# Patient Record
Sex: Female | Born: 1970 | Race: Black or African American | Hispanic: No | Marital: Married | Smoking: Never smoker
Health system: Southern US, Community
[De-identification: ages and names within clinical notes are randomized; demographics above are authoritative.]

## PROBLEM LIST (undated history)

## (undated) DIAGNOSIS — G629 Polyneuropathy, unspecified: Secondary | ICD-10-CM

## (undated) DIAGNOSIS — D259 Leiomyoma of uterus, unspecified: Secondary | ICD-10-CM

## (undated) DIAGNOSIS — K219 Gastro-esophageal reflux disease without esophagitis: Secondary | ICD-10-CM

## (undated) DIAGNOSIS — I499 Cardiac arrhythmia, unspecified: Secondary | ICD-10-CM

## (undated) DIAGNOSIS — M5126 Other intervertebral disc displacement, lumbar region: Secondary | ICD-10-CM

## (undated) DIAGNOSIS — N289 Disorder of kidney and ureter, unspecified: Secondary | ICD-10-CM

## (undated) DIAGNOSIS — R000 Tachycardia, unspecified: Secondary | ICD-10-CM

## (undated) DIAGNOSIS — E119 Type 2 diabetes mellitus without complications: Secondary | ICD-10-CM

## (undated) DIAGNOSIS — I10 Essential (primary) hypertension: Secondary | ICD-10-CM

## (undated) DIAGNOSIS — R51 Headache: Secondary | ICD-10-CM

## (undated) DIAGNOSIS — G43909 Migraine, unspecified, not intractable, without status migrainosus: Secondary | ICD-10-CM

## (undated) DIAGNOSIS — K589 Irritable bowel syndrome without diarrhea: Secondary | ICD-10-CM

## (undated) HISTORY — PX: TUBAL LIGATION: SHX77

## (undated) HISTORY — DX: Essential (primary) hypertension: I10

## (undated) HISTORY — DX: Leiomyoma of uterus, unspecified: D25.9

## (undated) HISTORY — DX: Type 2 diabetes mellitus without complications: E11.9

## (undated) HISTORY — DX: Cardiac arrhythmia, unspecified: I49.9

---

## 1998-12-30 ENCOUNTER — Ambulatory Visit (HOSPITAL_COMMUNITY): Admission: RE | Admit: 1998-12-30 | Discharge: 1998-12-30 | Payer: Self-pay | Admitting: *Deleted

## 1998-12-30 ENCOUNTER — Encounter: Payer: Self-pay | Admitting: *Deleted

## 1999-01-19 ENCOUNTER — Ambulatory Visit (HOSPITAL_COMMUNITY): Admission: RE | Admit: 1999-01-19 | Discharge: 1999-01-19 | Payer: Self-pay | Admitting: Gastroenterology

## 2001-01-22 ENCOUNTER — Other Ambulatory Visit: Admission: RE | Admit: 2001-01-22 | Discharge: 2001-01-22 | Payer: Self-pay | Admitting: Obstetrics and Gynecology

## 2002-02-18 ENCOUNTER — Other Ambulatory Visit: Admission: RE | Admit: 2002-02-18 | Discharge: 2002-02-18 | Payer: Self-pay | Admitting: Obstetrics and Gynecology

## 2003-03-11 ENCOUNTER — Other Ambulatory Visit: Admission: RE | Admit: 2003-03-11 | Discharge: 2003-03-11 | Payer: Self-pay | Admitting: *Deleted

## 2004-04-24 ENCOUNTER — Other Ambulatory Visit: Admission: RE | Admit: 2004-04-24 | Discharge: 2004-04-24 | Payer: Self-pay | Admitting: Obstetrics and Gynecology

## 2005-12-14 ENCOUNTER — Encounter: Admission: RE | Admit: 2005-12-14 | Discharge: 2005-12-14 | Payer: Self-pay | Admitting: Neurosurgery

## 2006-02-13 ENCOUNTER — Encounter: Admission: RE | Admit: 2006-02-13 | Discharge: 2006-02-13 | Payer: Self-pay | Admitting: Internal Medicine

## 2008-01-01 ENCOUNTER — Ambulatory Visit (HOSPITAL_COMMUNITY): Admission: RE | Admit: 2008-01-01 | Discharge: 2008-01-01 | Payer: Self-pay | Admitting: Obstetrics and Gynecology

## 2009-05-13 ENCOUNTER — Encounter: Admission: RE | Admit: 2009-05-13 | Discharge: 2009-05-13 | Payer: Self-pay | Admitting: Family Medicine

## 2011-02-13 ENCOUNTER — Ambulatory Visit (INDEPENDENT_AMBULATORY_CARE_PROVIDER_SITE_OTHER): Payer: Federal, State, Local not specified - PPO

## 2011-02-13 DIAGNOSIS — E860 Dehydration: Secondary | ICD-10-CM

## 2011-02-13 DIAGNOSIS — J159 Unspecified bacterial pneumonia: Secondary | ICD-10-CM

## 2011-02-13 DIAGNOSIS — R002 Palpitations: Secondary | ICD-10-CM

## 2011-02-16 ENCOUNTER — Ambulatory Visit (INDEPENDENT_AMBULATORY_CARE_PROVIDER_SITE_OTHER): Payer: Federal, State, Local not specified - PPO

## 2011-02-16 DIAGNOSIS — R0602 Shortness of breath: Secondary | ICD-10-CM

## 2011-02-16 DIAGNOSIS — J159 Unspecified bacterial pneumonia: Secondary | ICD-10-CM

## 2012-01-22 ENCOUNTER — Other Ambulatory Visit: Payer: Self-pay | Admitting: Internal Medicine

## 2012-01-22 DIAGNOSIS — N949 Unspecified condition associated with female genital organs and menstrual cycle: Secondary | ICD-10-CM

## 2012-01-24 ENCOUNTER — Ambulatory Visit
Admission: RE | Admit: 2012-01-24 | Discharge: 2012-01-24 | Disposition: A | Discharge status: Home / Self Care | Payer: Federal, State, Local not specified - PPO | Source: Ambulatory Visit | Attending: Internal Medicine | Admitting: Internal Medicine

## 2012-01-24 DIAGNOSIS — N949 Unspecified condition associated with female genital organs and menstrual cycle: Secondary | ICD-10-CM

## 2012-10-19 ENCOUNTER — Ambulatory Visit (INDEPENDENT_AMBULATORY_CARE_PROVIDER_SITE_OTHER): Payer: Federal, State, Local not specified - PPO | Admitting: Family Medicine

## 2012-10-19 VITALS — BP 126/66 | HR 84 | Temp 99.3°F | Resp 16 | Ht 67.0 in | Wt 162.0 lb

## 2012-10-19 DIAGNOSIS — M7521 Bicipital tendinitis, right shoulder: Secondary | ICD-10-CM

## 2012-10-19 DIAGNOSIS — M7520 Bicipital tendinitis, unspecified shoulder: Secondary | ICD-10-CM

## 2012-10-19 MED ORDER — PREDNISONE 20 MG PO TABS: ORAL_TABLET | ORAL | Status: DC

## 2012-10-19 NOTE — Progress Notes (Signed)
  Subjective:    Patient ID: Ashley Snyder, female    DOB: 07/01/1970, 42 y.o.   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 Has been going on for extended period of time. Hasnt taken anything for symptoms.   Review of Systems     Objective:   Physical Exam Tender proximal biceps tendon of right shoulder.  FROM shoulder and neck        Assessment & Plan:

## 2012-10-19 NOTE — Patient Instructions (Addendum)
Biceps Tendon Tendinitis (Proximal) and Tenosynovitis with Rehab Tendonitis and tenosynovitis involve inflammation of the tendon and the tendon lining (sheath). The proximal biceps tendon is vulnerable to tendonitis and tenosynovitis, which causes pain and discomfort in the front of the shoulder and upper arm. The tendon lining secretes a fluid that helps lubricate the tendon, allowing for proper function without pain. When the tendon and its lining become inflamed, the tendon can no longer glide smoothly, causing pain. The proximal biceps tendon connects the biceps muscle to two bones of the shoulder. It is important for proper function of the elbow and turning the palm upward (supination) using the wrist. Proximal biceps tendon tendinitis may include a grade 1 or 2 strain of the tendon. Grade 1 strains involve a slight pull of the tendon without signs of tearing and no observed tendon lengthening. There is also no loss of strength. Grade 2 strains involve small tears in the tendon fibers. The tendon or muscle is stretched and strength is usually decreased.  SYMPTOMS   Pain, tenderness, swelling, warmth, or redness over the front of the shoulder.  Pain that gets worse with shoulder and elbow use, especially against resistance.  Limited motion of the shoulder or elbow.  Crackling sound (crepitation) when the tendon or shoulder is moved or touched. CAUSES  The symptoms of biceps tendonitis are due to inflammation of the tendon. Inflammation may be caused by:  Strain from sudden increase in amount or intensity of activity.  Direct blow or injury to the elbow (uncommon).  Overuse or repetitive elbow bending or wrist rotation, particularly when turning the palm up, or with elbow hyperextension. RISK INCREASES WITH:  Sports that involve contact or overhead arm activity (throwing sports, gymnastics, weightlifting, bodybuilding, rock climbing).  Heavy labor.  Poor strength and  flexibility.  Failure to warm up properly before activity. PREVENTION  Warm up and stretch properly before activity.  Allow time for recovery between activities.  Maintain physical fitness:  Strength, flexibility, and endurance.  Cardiovascular fitness.  Learn and use proper exercise technique. PROGNOSIS  With proper treatment, proximal biceps tendon tendonitis and tenosynovitis is usually curable within 6 weeks. Healing is usually quicker if the cause was a direct blow, not overuse.  RELATED COMPLICATIONS   Longer healing time if not properly treated or if not given enough time to heal.  Chronically inflamed tendon that causes persistent pain with activity, that may progress to constant pain and potentially rupture of the tendon.  Recurring symptoms, especially if activity is resumed too soon or with overuse, a direct blow, or use of poor exercise technique. TREATMENT Treatment first involves ice and medicine, to reduce pain and inflammation. It is helpful to modify activities that cause pain, to reduce the chances of causing the condition to get worse. Strengthening and stretching exercises should be performed to promote proper use of the muscles of the shoulder. These exercises may be performed at home or with a therapist. Other treatments may be given such as ultrasound or heat therapy. A corticosteroid injection may be recommended to help reduce inflammation of the tendon lining. Surgery is usually not necessary. Sometimes, if symptoms last for greater than 6 months, surgery will be advised to detach the tendon and re-insert it into the arm bone. Surgery to correct other shoulder problems that may be contributing to tendinitis may be advised before surgery for the tendinitis itself.  MEDICATION  If pain medicine is needed, nonsteroidal anti-inflammatory medicines (aspirin and ibuprofen), or other minor pain relievers (  acetaminophen), are often advised.  Do not take pain medicine  for 7 days before surgery.  Prescription pain relievers may be given if your caregiver thinks they are needed. Use only as directed and only as much as you need.  Corticosteroid injections may be given. These injections should only be used on the most severe cases, as one can only receive a limited number of them. HEAT AND COLD   Cold treatment (icing) should be applied for 10 to 15 minutes every 2 to 3 hours for inflammation and pain, and immediately after activity that aggravates your symptoms. Use ice packs or an ice massage.  Heat treatment may be used before performing stretching and strengthening activities prescribed by your caregiver, physical therapist, or athletic trainer. Use a heat pack or a warm water soak. SEEK MEDICAL CARE IF:   Symptoms get worse or do not improve in 2 weeks, despite treatment.  New, unexplained symptoms develop. (Drugs used in treatment may produce side effects.) EXERCISES RANGE OF MOTION (ROM) AND EXERCISES - Biceps Tendon (Proximal) and Tenosynovitis These exercises may help you when beginning to rehabilitate your injury. Your symptoms may go away with or without further involvement from your physician, physical therapist, or athletic trainer. While completing these exercises, remember:   Restoring tissue flexibility helps normal motion to return to the joints. This allows healthier, less painful movement and activity.  An effective stretch should be held for at least 30 seconds.  A stretch should never be painful. You should only feel a gentle lengthening or release in the stretched tissue. STRETCH  Flexion, Standing  Stand with good posture. With an underhand grip on your right / left hand and an overhand grip on the opposite hand, grasp a broomstick or cane so that your hands are a little more than shoulder width apart.  Keeping your right / left elbow straight and shoulder muscles relaxed, push the stick with your opposite hand to raise your right /  left arm in front of your body and then overhead. Raise your arm until you feel a stretch in your right / left shoulder, but before you have increased shoulder pain.  Try to avoid shrugging your right / left shoulder as your arm rises, by keeping your shoulder blade tucked down and toward your mid-back spine. Hold for __________ seconds.  Slowly return to the starting position. Repeat __________ times. Complete this exercise __________ times per day. STRETCH  Abduction, Supine  Lie on your back. With an underhand grip on your right / left hand and an overhand grip on the opposite hand, grasp a broomstick or cane so that your hands are a little more than shoulder width apart.  Keeping your right / left elbow straight and shoulder muscles relaxed, push the stick with your opposite hand to raise your right / left arm out to the side of your body and then overhead. Raise your arm until you feel a stretch in your right / left shoulder, but before you have increased shoulder pain.  Try to avoid shrugging your right / left shoulder as your arm rises, by keeping your shoulder blade tucked down and toward your mid-back spine. Hold for __________ seconds.  Slowly return to the starting position. Repeat __________ times. Complete this exercise __________ times per day. ROM  Flexion, Active-Assisted  Lie on your back. You may bend your knees for comfort.  Grasp a broomstick or cane so your hands are about shoulder width apart. Your right / left hand should   grip the end of the stick so that your hand is positioned "thumbs-up," as if you were about to shake hands.  Using your healthy arm to lead, raise your right / left arm overhead until you feel a gentle stretch in your shoulder. Hold for __________ seconds.  Use the stick to assist in returning your right / left arm to its starting position. Repeat __________ times. Complete this exercise __________ times per day.  STRETCH  Flexion, Standing   Stand  facing a wall. Walk your right / left fingers up the wall until you feel a moderate stretch in your shoulder. As your hand gets higher, you may need to step closer to the wall or use a door frame to walk through.  Try to avoid shrugging your right / left shoulder as your arm rises, by keeping your shoulder blade tucked down and toward your mid-back spine.  Hold for __________ seconds. Use your other hand, if needed, to ease out of the stretch and return to the starting position. Repeat __________ times. Complete this exercise __________ times per day.  ROM - Internal Rotation   Using underhand grips, grasp a stick behind your back with both hands.  While standing upright with good posture, slide the stick up your back until you feel a mild stretch in the front of your shoulder.  Hold for __________ seconds. Slowly return to your starting position. Repeat __________ times. Complete this exercise __________ times per day.  STRETCH - Internal Rotation  Place your right / left hand behind your back, palm-up.  Throw a towel or belt over your opposite shoulder. Grasp the towel with your right / left hand.  While keeping an upright posture, gently pull up on the towel until you feel a stretch in the front of your right / left shoulder.  Avoid shrugging your right / left shoulder as your arm rises, by keeping your shoulder blade tucked down and toward your mid-back spine.  Hold for __________ seconds. Release the stretch by lowering your opposite hand. Repeat __________ times. Complete this exercise __________ times per day. STRENGTHENING EXERCISES - Biceps Tendon Tendinitis (Proximal) and Tenosynovitis These exercises may help you regain your strength after your physician has discontinued your restraint in a cast or brace. They may resolve your symptoms with or without further involvement from your physician, physical therapist or athletic trainer. While completing these exercises, remember:    Muscles can gain both the endurance and the strength needed for everyday activities through controlled exercises.  Complete these exercises as instructed by your physician, physical therapist or athletic trainer. Increase the resistance and repetitions only as guided.  You may experience muscle soreness or fatigue, but the pain or discomfort you are trying to eliminate should never worsen during these exercises. If this pain does get worse, stop and make sure you are following the directions exactly. If the pain is still present after adjustments, discontinue the exercise until you can discuss the trouble with your caregiver. STRENGTH - Elbow Flexors, Isometric  Stand or sit upright on a firm surface. Place your right / left arm so that your hand is palm-up and at the height of your waist.  Place your opposite hand on top of your forearm. Gently push down as your right / left arm resists. Push as hard as you can with both arms, without causing any pain or movement at your right / left elbow. Hold this stationary position for __________ seconds.  Gradually release the tension in both   arms. Allow your muscles to relax completely before repeating. Repeat __________ times. Complete this exercise __________ times per day. STRENGTH - Shoulder Flexion, Isometric  With good posture and facing a wall, stand or sit about 4-6 inches away.  Keeping your right / left elbow straight, gently press the top of your fist into the wall. Increase the pressure gradually until you are pressing as hard as you can, without shrugging your shoulder or increasing any shoulder discomfort.  Hold for __________ seconds.  Release the tension slowly. Relax your shoulder muscles completely before you start the next repetition. Repeat __________ times. Complete this exercise __________ times per day.  STRENGTH  Elbow Flexors, Supinated  With good posture, stand or sit on a firm chair without armrests. Allow your right /  left arm to rest at your side with your palm facing forward.  Holding a __________ weight, or gripping a rubber exercise band or tubing,  bring your hand toward your shoulder.  Allow your muscles to control the resistance as your hand returns to your side. Repeat __________ times. Complete this exercise __________ times per day.  STRENGTH - Shoulder Flexion  Stand or sit with good posture. Grasp a __________ weight, or an exercise band or tubing, so that your hand is "thumbs-up," like when you shake hands.  Slowly lift your right / left arm as far as you can, without increasing any shoulder pain. At first, many people can only raise their hand to shoulder height.  Avoid shrugging your right / left shoulder as your arm rises, by keeping your shoulder blade tucked down and toward your mid-back spine.  Hold for __________ seconds. Control the descent of your hand as you slowly return to your starting position. Repeat __________ times. Complete this exercise __________ times per day. Document Released: 02/19/2005 Document Revised: 05/14/2011 Document Reviewed: 06/03/2008 [employer] Patient Information 2014 [employer].  

## 2012-12-02 ENCOUNTER — Emergency Department (INDEPENDENT_AMBULATORY_CARE_PROVIDER_SITE_OTHER)
Admission: EM | Admit: 2012-12-02 | Discharge: 2012-12-02 | Disposition: A | Discharge status: Home / Self Care | Payer: Worker's Compensation | Source: Home / Self Care | Attending: Emergency Medicine | Admitting: Emergency Medicine

## 2012-12-02 ENCOUNTER — Encounter (HOSPITAL_COMMUNITY): Payer: Self-pay | Admitting: Emergency Medicine

## 2012-12-02 DIAGNOSIS — S0990XA Unspecified injury of head, initial encounter: Secondary | ICD-10-CM | POA: Diagnosis not present

## 2012-12-02 MED ORDER — DICLOFENAC SODIUM 75 MG PO TBEC: 75.0000 mg | DELAYED_RELEASE_TABLET | Freq: Two times a day (BID) | ORAL | Status: DC

## 2012-12-02 MED ORDER — IBUPROFEN 800 MG PO TABS
ORAL_TABLET | ORAL | Status: AC
  Filled 2012-12-02: qty 1

## 2012-12-02 MED ORDER — IBUPROFEN 800 MG PO TABS
800.0000 mg | ORAL_TABLET | Freq: Once | ORAL | Status: AC
  Administered 2012-12-02: 800 mg via ORAL

## 2012-12-02 NOTE — ED Notes (Signed)
Pt c/o head inj today at work onset 1700 Pt reports she hit her head against a metal gate to the left temporal side Sxs include: intermittent HA, teeth pain Denies: LOC, n/v, blurry vision Has not had any meds for sxs.... She is alert w/no signs of acute distress.

## 2012-12-02 NOTE — ED Provider Notes (Signed)
Chief Complaint:   Chief Complaint  Patient presents with  . Head Injury    History of Present Illness:   Ashley Snyder is a 42 year old female who around 5 PM was struck by a metal door at work in her left temple. The patient states that she "saw stars" for a few seconds, but there was no loss of consciousness. He she has had some headache ever since then and notes aching in her teeth and she's also had some aching in her sinus areas. She denies any diplopia, blurry vision, or fluid or blood from the nose or her ears. She denies any loose or broken teeth or neck pain. She does not have difficulty with focusing, thinking, or concentrating. She denies feeling fuzzy, lightheaded, or dizzy. There has been no ringing in her ears. She denies any numbness, tingling, paresthesias, muscle weakness, difficulty with speech, swallowing, ambulation, coordination, balance, or equilibrium. No nausea or vomiting.  Review of Systems:  Other than noted above, the patient denies any of the following symptoms: Systemic:  No fever or chills. Eye:  No eye pain, redness, diplopia or blurred vision ENT:  No bleeding from nose or ears.  No loose or broken teeth. Neck:  No pain or limited ROM. GI:  No nausea or vomiting. Neuro:  No loss of consciousness, seizure activity, numbness, tingling, or weakness.  PMFSH:  Past medical history, family history, social history, meds, and allergies were reviewed. No history of anticoagulent use.    Physical Exam:   Vital signs:  BP 141/86  Pulse 87  Temp(Src) 97.9 F (36.6 C) (Oral)  Resp 16  SpO2 100% General:  Alert and oriented times 3.  In no distress. Eye:  PERRL, full EOMs.  Lids and conjunctivas normal. Fundi benign. HEENT:  There is mild pain to palpation in the left temple area. No bruising, swelling, deformity, laceration, or abrasion. TMs and canals normal, nasal mucosa normal.  No oral lacerations.  Teeth were intact without obvious oral trauma. Neck:   Non tender.  Full ROM without pain. Neurological:  Alert and oriented.  Cranial nerves intact.  No pronator drift. Finger to nose test was normal.  No muscle weakness. DTRs were symmetrical.  Sensation was intact to light touch. Gait was normal.  Upon trying to perform Romberg sign, she fell to the right once but then was able to perform a well without any difficulty. She does have a little difficulty performing tandem gait.  Course in Urgent Care Center:   Given ibuprofen 800 mg by mouth.  Assessment:  The encounter diagnosis was Head injury, initial encounter.  No evidence of concussion or intracranial injury.  Plan:   1.  Meds:  The following meds were prescribed:   Discharge Medication List as of 12/02/2012  8:32 PM    START taking these medications   Details  diclofenac (VOLTAREN) 75 MG EC tablet Take 1 tablet (75 mg total) by mouth 2 (two) times daily., Starting 12/02/2012, Until Discontinued, Normal        2.  Patient Education/Counseling:  The patient was given appropriate handouts, self care instructions, and instructed in symptomatic relief.  Husband will observe her tonight and he was given a handout detailing what to watch for. He will have her taken immediately by and was in the hospital if there any concerning symptoms.  3.  Follow up:  The patient was told to follow up if no better in 3 to 4 days, if becoming worse in any way,  and given some red flag symptoms such as any neurological changes, persistent vomiting, or severe headache which would prompt immediate return.  Follow up here or at the emergency department as needed.      Reuben Likes, MD 12/02/12 [phone]

## 2013-05-12 ENCOUNTER — Encounter: Payer: Self-pay | Admitting: *Deleted

## 2013-05-13 ENCOUNTER — Ambulatory Visit (INDEPENDENT_AMBULATORY_CARE_PROVIDER_SITE_OTHER): Payer: Self-pay | Admitting: *Deleted

## 2013-05-13 DIAGNOSIS — I781 Nevus, non-neoplastic: Secondary | ICD-10-CM

## 2013-05-13 NOTE — Progress Notes (Signed)
X=.3% Sotradecol administered with a 27g butterfly.  Patient received a total of 5cc.  Treated all areas of concern. Tol procedure well. Anticipate good results. Will follow prn.  Photos: yes  Compression stockings applied: yes

## 2013-05-14 ENCOUNTER — Encounter: Payer: Self-pay | Admitting: *Deleted

## 2013-05-14 ENCOUNTER — Telehealth: Payer: Self-pay | Admitting: *Deleted

## 2013-05-14 NOTE — Telephone Encounter (Signed)
Returning Ashley Snyder earlier call regarding "blistering at the top of the compression hose where the grippers are."  Ashley Snyder stated that she had rolled the compression hose down so they did not touch the area that had blistered,  Explained to her that small percentage of patients have skin irritation to silicon beading at the top of the compression hose.  Recommended that she turn the compression hose inside out so the silicon beading did not touch her skin and either wear Spanx over top of hose to hold the compression hose up or wrap the top of her compression hose with Ace bandage to hold the compression hose up.

## 2013-07-29 ENCOUNTER — Ambulatory Visit (INDEPENDENT_AMBULATORY_CARE_PROVIDER_SITE_OTHER): Payer: Federal, State, Local not specified - PPO | Admitting: Family Medicine

## 2013-07-29 VITALS — BP 132/84 | HR 76 | Temp 99.9°F | Resp 18 | Wt 162.0 lb

## 2013-07-29 DIAGNOSIS — IMO0002 Reserved for concepts with insufficient information to code with codable children: Secondary | ICD-10-CM

## 2013-07-29 DIAGNOSIS — M792 Neuralgia and neuritis, unspecified: Secondary | ICD-10-CM

## 2013-07-29 MED ORDER — GABAPENTIN 100 MG PO CAPS: 100.0000 mg | ORAL_CAPSULE | Freq: Every day | ORAL | Status: DC

## 2013-07-29 NOTE — Progress Notes (Signed)
Chief Complaint:  Chief Complaint  Patient presents with  . Foot Pain    nerve pain in left foot    HPI: Ashley Snyder is a 43 y.o. female who is here for nerve pain in her foot from herniated discs Was on Lyrica in the past, she has not had pain in her foot for at least a year or at least a couple of years.  She takes the lyrica prn, which she knwo sshe is not supposed to but it worked when she needed it She is not diabetic  IMPRESSION:  1. Stable annular rent and a very shallow central disc protrusion  at L4-5.  2. Stable broad-based central and left paracentral protrusion at  L5-S1 with minimal caudal down turning.   Past Medical History  Diagnosis Date  . Arrhythmia   . Uterine fibroid    Past Surgical History  Procedure Laterality Date  . Tubal ligation     History   Social History  . Marital Status: Married    Spouse Name: N/A    Number of Children: N/A  . Years of Education: N/A   Social History Main Topics  . Smoking status: Never Smoker   . Smokeless tobacco: None  . Alcohol Use: No  . Drug Use: No  . Sexual Activity: None   Other Topics Concern  . None   Social History Narrative  . None   Family History  Problem Relation Age of Onset  . Colitis Mother   . Hypertension Mother    No Known Allergies Prior to Admission medications   Medication Sig Start Date End Date Taking? Authorizing Provider  METOPROLOL TARTRATE PO Take by mouth.   Yes Historical Provider, MD  Norethin Ace-Eth Estrad-FE (LOESTRIN 24 FE PO) Take by mouth.   Yes Historical Provider, MD     ROS: The patient denies fevers, chills, night sweats, unintentional weight loss, chest pain, palpitations, wheezing, dyspnea on exertion, nausea, vomiting, abdominal pain, dysuria, hematuria, melena  All other systems have been reviewed and were otherwise negative with the exception of those mentioned in the HPI and as above.    PHYSICAL EXAM: Filed Vitals:   07/29/13  1152  BP: 132/84  Pulse: 76  Temp: 99.9 F (37.7 C)  Resp: 18   Filed Vitals:   07/29/13 1152  Weight: 162 lb (73.483 kg)   Body mass index is 25.37 kg/(m^2).  General: Alert, no acute distress HEENT:  Normocephalic, atraumatic, oropharynx patent. EOMI, PERRLA Cardiovascular:  Regular rate and rhythm, no rubs murmurs or gallops.  No Carotid bruits, radial pulse intact. No pedal edema.  Respiratory: Clear to auscultation bilaterally.  No wheezes, rales, or rhonchi.  No cyanosis, no use of accessory musculature GI: No organomegaly, abdomen is soft and non-tender, positive bowel sounds.  No masses. Skin: No rashes. Neurologic: Facial musculature symmetric. Psychiatric: Patient is appropriate throughout our interaction. Lymphatic: No cervical lymphadenopathy Musculoskeletal: Gait intact. + paramsk tenderness  Full ROM 5/5 strength, 2/2 DTRs No saddle anesthesia Straight leg neg Hip and knee exam--normal   LABS: No results found for this or any previous visit.   EKG/XRAY:   Primary read interpreted by Dr. Marin Comment at [facility].   ASSESSMENT/PLAN: Encounter Diagnoses  Name Primary?  . Neuropathic pain Yes  . Herniated disc    Rx neurontin, if no improvement then consider Lyrica again She does not want prednisone at this time F/u prn  Gross sideeffects, risk and benefits, and alternatives of  medications d/w patient. Patient is aware that all medications have potential sideeffects and we are unable to predict every sideeffect or drug-drug interaction that may occur.  Glenford Bayley, DO 07/31/2013 2:39 PM

## 2013-11-15 ENCOUNTER — Emergency Department (INDEPENDENT_AMBULATORY_CARE_PROVIDER_SITE_OTHER)
Admission: EM | Admit: 2013-11-15 | Discharge: 2013-11-15 | Disposition: A | Discharge status: Home / Self Care | Payer: Federal, State, Local not specified - PPO | Source: Home / Self Care | Attending: Family Medicine | Admitting: Family Medicine

## 2013-11-15 ENCOUNTER — Encounter (HOSPITAL_COMMUNITY): Payer: Self-pay | Admitting: Emergency Medicine

## 2013-11-15 DIAGNOSIS — M65839 Other synovitis and tenosynovitis, unspecified forearm: Secondary | ICD-10-CM

## 2013-11-15 DIAGNOSIS — M659 Synovitis and tenosynovitis, unspecified: Secondary | ICD-10-CM

## 2013-11-15 DIAGNOSIS — M65849 Other synovitis and tenosynovitis, unspecified hand: Secondary | ICD-10-CM

## 2013-11-15 MED ORDER — DICLOFENAC SODIUM 75 MG PO TBEC: 75.0000 mg | DELAYED_RELEASE_TABLET | Freq: Two times a day (BID) | ORAL | Status: DC

## 2013-11-15 MED ORDER — PREDNISONE 50 MG PO TABS: ORAL_TABLET | ORAL | Status: DC

## 2013-11-15 MED ORDER — KETOROLAC TROMETHAMINE 60 MG/2ML IM SOLN
60.0000 mg | Freq: Once | INTRAMUSCULAR | Status: AC
  Administered 2013-11-15: 60 mg via INTRAMUSCULAR

## 2013-11-15 MED ORDER — KETOROLAC TROMETHAMINE 60 MG/2ML IM SOLN
INTRAMUSCULAR | Status: AC
  Filled 2013-11-15: qty 2

## 2013-11-15 NOTE — Discharge Instructions (Signed)
You likely have an inflammed tendon or tendon sheath.  Please wear the wrist splint 24/7 for the next 2 weeks. Only take your hand out for showering and your exercises Please start the steroids on 9/14.  Please start the voltaren in 7 days.  Please follow up her or with sports medicine or yoru regular doctor if needed if you are not better in 2-4 weeks      Extensor Carpi Ulnaris Tendinitis with Rehab Tendonitis involves inflammation of a tendon, which is a soft tissue that connects muscle to bone. The Extensor Carpi Ulnaris (ECU) tendon is vulnerable to tendonitis. The ECU is responsible for straightening and rotating (abducting) the wrist. The ECU is important for gripping and pulling. ECU tendonitis may include inflammation of the ECU tendon lining (sheath). The tendon sheath usually secretes a fluid that allows the tendon to function smoothly, but when it becomes inflamed, function is impaired. This condition may also include a tear in the ECU tendon or muscle (strain). The strain may be classified as a grade 1 or 2 strain. Grade 1 strains cause pain, but the tendon is not lengthened. Grade 2 strains include a lengthened ligament, due to being stretched or partially ruptured. With grade 2 strains there is still function, although the function may be reduced. SYMPTOMS   Pain, tenderness, swelling, warmth, or redness on the little finger side of the wrist.  Pain that worsens when straightening the wrist or bending it toward the little finger.  Pain with gripping.  Limited motion of the wrist.  Crackling sound (crepitation) when the tendon or wrist is moved or touched. CAUSES  ECU tendonitis is caused by injury to the ECU tendon. It is usually due to chronic or repetitive injuries, but may be due to sharp (acute) injury. Common causes of injury include:  Strain from unusual use, overuse, or increase in activity, or change in activity of the wrist, hand, or forearm.  Direct hit (trauma)  to the muscles and tendon on the side of the wrist.  Repetitive motions of the hand and wrist, due to friction of the tendon within the tendon lining. RISK INCREASES WITH:  Sports that involve repetitive hand and wrist motions (i.e. golfing, bowling).  Sports that require gripping (i.e. tennis, golf, weightlifting).  Heavy labor.  Poor wrist and forearm strength and flexibility.  Failure to warm-up properly before activity. PREVENTION   Warm up and stretch properly before activity.  Allow the body to recover between activities.  Maintain physical fitness:  Strength, flexibility, and endurance.  Cardiovascular fitness.  Learn and use proper exercise technique. PROGNOSIS  If treated properly, ECU tendonitis is usually curable within 6 weeks.  RELATED COMPLICATIONS   Longer healing time, if not properly treated or if not given adequate time to heal.  Chronically inflamed tendon, causing persistent pain with activity. This may progress to constant pain, restriction of motion, and potentially tearing of the tendon.  Recurring symptoms, especially if activity is resumed too soon.  Risks of surgery: infection, bleeding, injury to nerves, continued pain, incomplete release of the tendon lining, recurring symptoms, cutting of the tendon, and weakness of the wrist and grip. TREATMENT  Treatment initially involves the use of ice and medicine, to help reduce pain and inflammation. Performing stretching and strengthening exercises regularly is important for a quick recovery. These exercises may be completed at home or with a therapist. Your caregiver may recommend the use of a brace or splint to reduce motions that aggravate symptoms. Corticosteroid injections  may be recommended. If non-surgical treatment is unsuccessful, then surgery may be needed.  MEDICATION   If pain medicine is needed, nonsteroidal anti-inflammatory medicines (aspirin and ibuprofen), or other minor pain relievers  (acetaminophen), are often recommended.  Do not take pain medicine for 7 days before surgery.  Prescription pain relievers may be given if your caregiver thinks they are needed. Use only as directed and only as much as you need.  Corticosteroid injections may be given to reduce inflammation. However, these injections should be reserved for serious cases, as they may only be given a certain number of times. COLD THERAPY  Cold treatment (icing) relieves pain and reduces inflammation. Cold treatment should be applied for 10 to 15 minutes every 2 to 3 hours, and immediately after activity that aggravates your symptoms. Use ice packs or an ice massage. SEEK MEDICAL CARE IF:   Symptoms get worse or do not improve in 2 weeks, despite treatment.  You experience pain, numbness, or coldness in the hand.  Blue, gray, or dark color appears in the fingernails.  Any of the following occur after surgery: increased pain, swelling, redness, drainage of fluids, bleeding in the surgical area, or signs of infection.  New, unexplained symptoms develop. (Drugs used in treatment may produce side effects.) EXERCISES RANGE OF MOTION (ROM) AND STRETCHING EXERCISES - Extensor Carpi Ulnaris Tendinitis These are some of the initial exercises you may start your recovery program with, until you see your caregiver again or until your symptoms are resolved. Remember:  Flexible tissue is more tolerant of the stresses placed on it during activity.  Each stretch should be held for 20 to 30 seconds.  A gentle stretching sensation should be felt. RANGE OF MOTION - Wrist Flexion  Hold your right / left wrist with the fingers pointing down toward the floor.  Pull down on the wrist until you feel a stretch.  Hold this position for __________ seconds. Repeat exercise __________ times, __________ times per day.  This exercise should be done with the elbow: _____ Bent to 90 degrees. _____ Straight. RANGE OF MOTION -  Wrist Flexion  Place the back of your right / left hand flat on the top of a table. Your shoulder should be turned in and your fingers facing away from your body.  Press down, bending your wrist and straightening your elbow until your feel a stretch.  Hold this position for __________ seconds.  Repeat exercise __________ times, __________ times per day. STRENGTHENING EXERCISES - Extensor Carpi Ulnaris Tendinitis These are some of the initial exercises you may start your recovery program with, until you see your caregiver again or until your symptoms are resolved. Remember:  Strong muscles with good endurance tolerate stress better.  Do the exercises as initially prescribed by your caregiver. Progress slowly with each exercise, gradually increasing the number of repetitions and weight used, only as guided. RANGE OF MOTION - Wrist Extensors  Sit or stand with your forearm supported.  Using a __________ weight or a piece of rubber band or tubing, bend your wrist slowly upward toward you.  Hold this position for __________ seconds and then slowly lower the wrist back to the starting position.  Repeat exercise __________ times, __________ times per day. RANGE OF MOTION - Wrist, Ulnar Deviation  Stand with a hammer in your hand, or sit while holding onto a rubber band or tubing with both hands, and with your arm supported on a table.  Raise your hand with the hammer upward behind  you, or pull down on the rubber tubing.  Hold this position for __________ seconds and then slowly lower the wrist back to the starting position.  Repeat exercise __________ times, __________ times per day. STRENGTH - Grip  Hold a wad of putty, soft modeling clay, a large sponge, a soft rubber ball or a soft tennis ball in your hand.  Squeeze as hard as you can.  Hold this position for __________ seconds.  Repeat exercise __________ times, __________ times per day. Document Released: 02/19/2005 Document  Revised: 05/14/2011 Document Reviewed: 06/03/2008 [facility] Patient Information 2015 [place], [place]. This information is not intended to replace advice given to you by your health care provider. Make sure you discuss any questions you have with your health care provider.

## 2013-11-15 NOTE — ED Notes (Signed)
Right wrist pain, no known injury.  Patient has tingling/numbness in fingers, intermittently.  Discomfort wakes her at night.  Pain in right wrist, behind thumb

## 2013-11-15 NOTE — ED Provider Notes (Addendum)
CSN: [id number]     Arrival date & time 11/15/13  1810 History   First MD Initiated Contact with Patient 11/15/13 1821     Chief Complaint  Patient presents with  . Wrist Pain   (Consider location/radiation/quality/duration/timing/severity/associated sxs/prior Treatment) HPI  Wrist pain: R wrist and back of hand. Started several months ago. Initially would wake up at night as a tingling and numb sensation. Intermittent. Aleve initially w/ benefit. Now coming on more constantly and symptoms during the day. Certain movements make it worse. Feels weaker in the hand. Denies injury to the wrist.    Past Medical History  Diagnosis Date  . Arrhythmia   . Uterine fibroid    Past Surgical History  Procedure Laterality Date  . Tubal ligation     Family History  Problem Relation Age of Onset  . Colitis Mother   . Hypertension Mother    History  Substance Use Topics  . Smoking status: Never Smoker   . Smokeless tobacco: Not on file  . Alcohol Use: No   OB History   Grav Para Term Preterm Abortions TAB SAB Ect Mult Living                 Review of Systems Per HPI with all other pertinent systems negative.   Allergies  Review of patient's allergies indicates no known allergies.  Home Medications   Prior to Admission medications   Medication Sig Start Date End Date Taking? Authorizing Provider  diclofenac (VOLTAREN) 75 MG EC tablet Take 1 tablet (75 mg total) by mouth 2 (two) times daily. 11/15/13   Waldemar Dickens, MD  gabapentin (NEURONTIN) 100 MG capsule Take 1 capsule (100 mg total) by mouth at bedtime. 07/29/13   Thao P Le, DO  METOPROLOL TARTRATE PO Take by mouth.    Historical Provider, MD  Norethin Ace-Eth Estrad-FE (LOESTRIN 24 FE PO) Take by mouth.    Historical Provider, MD  predniSONE (DELTASONE) 50 MG tablet Take 1 pill daily with breakfast 11/15/13   Waldemar Dickens, MD   BP 161/90  Pulse 81  Temp(Src) 99 F (37.2 C) (Oral)  Resp 16  SpO2 98%  LMP  10/15/2013 Physical Exam  Constitutional: She is oriented to person, place, and time. She appears well-developed and well-nourished. No distress.  HENT:  Head: Normocephalic and atraumatic.  Eyes: EOM are normal. Pupils are equal, round, and reactive to light.  Neck: Normal range of motion.  Cardiovascular: Normal rate.   Pulmonary/Chest: Effort normal. No respiratory distress.  Abdominal: She exhibits no distension.  Musculoskeletal: Normal range of motion. She exhibits no edema.  R wrist FROM but tender on flextion along the dorsal surface mid metacarpal. Nonttp. No edema. finkelstein's Negative.   Neurological: She is alert and oriented to person, place, and time. No cranial nerve deficit. Coordination normal.  Skin: Skin is warm. She is not diaphoretic.  Psychiatric: She has a normal mood and affect. Her behavior is normal. Thought content normal.    ED Course  Procedures (including critical care time) Labs Review Labs Reviewed - No data to display  Imaging Review No results found.   MDM   1. Tenosynovitis of hand    Possible extensor carpi ulnaris tendinitis but presentation, symjptoms and PE not classic.  Wrist splint - wear 24/7 for 2 wks (off for bathing and exercises/ROM) Start 7 days prednisone then voltaren BID  Exercises given Toradol 60mg  IM in office Precautions given and all questions answered   HTN:  hypertensive today. No h/o hypertension per pt. No CP or SOB. F/u PCP if continues to be elevated or becomes symptomatic.   Linna Darner, MD Family Medicine 11/15/2013, 7:08 PM      Waldemar Dickens, MD 11/15/13 1909  Waldemar Dickens, MD 11/15/13 1910

## 2013-12-17 ENCOUNTER — Ambulatory Visit (INDEPENDENT_AMBULATORY_CARE_PROVIDER_SITE_OTHER): Payer: Federal, State, Local not specified - PPO | Admitting: Emergency Medicine

## 2013-12-17 VITALS — BP 128/82 | HR 80 | Temp 99.2°F | Resp 20 | Ht 67.0 in | Wt 169.8 lb

## 2013-12-17 DIAGNOSIS — J0100 Acute maxillary sinusitis, unspecified: Secondary | ICD-10-CM

## 2013-12-17 MED ORDER — AMOXICILLIN-POT CLAVULANATE 875-125 MG PO TABS: 1.0000 | ORAL_TABLET | Freq: Two times a day (BID) | ORAL | Status: DC

## 2013-12-17 MED ORDER — TRIAMCINOLONE ACETONIDE 55 MCG/ACT NA AERO: 2.0000 | INHALATION_SPRAY | Freq: Every day | NASAL | Status: DC

## 2013-12-17 NOTE — Progress Notes (Addendum)
[facility] [address] [place] [address] [phone]  Date:  12/17/2013   Name:  Ashley Snyder   DOB:  1970-04-15   MRN:  [id number]  PCP:  Kandice Hams, MD    Chief Complaint: Sinusitis   History of Present Illness:  Ashley Snyder is a 43 y.o. very pleasant female patient who presents with the following:  Says she has sinus pressure in her frontal and maxillary area.  Taking swimming lessons. Has no nasal congestion or drainage.  No post nasal drip.  No fever or chills.  No wheezing or shortness of breath.  No cough No sore throat.  No improvement with over the counter medications or other home remedies.  Denies other complaint or health concern today.    There are no active problems to display for this patient.   Past Medical History  Diagnosis Date  . Arrhythmia   . Uterine fibroid     Past Surgical History  Procedure Laterality Date  . Tubal ligation      History  Substance Use Topics  . Smoking status: Never Smoker   . Smokeless tobacco: Not on file  . Alcohol Use: No    Family History  Problem Relation Age of Onset  . Colitis Mother   . Hypertension Mother     No Known Allergies  Medication list has been reviewed and updated.  Current Outpatient Prescriptions on File Prior to Visit  Medication Sig Dispense Refill  . METOPROLOL TARTRATE PO Take by mouth.      . Norethin Ace-Eth Estrad-FE (LOESTRIN 24 FE PO) Take by mouth.       No current facility-administered medications on file prior to visit.    Review of Systems:  As per HPI, otherwise negative.    Physical Examination: Filed Vitals:   12/17/13 1922  BP: 128/82  Pulse: 80  Temp: 99.2 F (37.3 C)  Resp: 20   Filed Vitals:   12/17/13 1922  Height: 5\' 7"  (1.702 m)  Weight: 169 lb 12.8 oz (77.021 kg)   Body mass index is 26.59 kg/(m^2). Ideal Body Weight: Weight in (lb) to have BMI = 25: 159.3   GEN: WDWN, NAD, Non-toxic,  Alert & Oriented x 3 HEENT: Atraumatic, Normocephalic.   Tender maxillary sinus Ears and Nose: No external deformity. EXTR: No clubbing/cyanosis/edema NEURO: Normal gait.  PSYCH: Normally interactive. Conversant. Not depressed or anxious appearing.  Calm demeanor.    Assessment and Plan: Sinusitis Amoxicillin mucinex d  Signed,  Ellison Carwin, MD   No results found for this or any previous visit.

## 2013-12-17 NOTE — Patient Instructions (Signed)

## 2014-09-04 ENCOUNTER — Ambulatory Visit (INDEPENDENT_AMBULATORY_CARE_PROVIDER_SITE_OTHER): Payer: Federal, State, Local not specified - PPO

## 2014-09-04 ENCOUNTER — Ambulatory Visit (INDEPENDENT_AMBULATORY_CARE_PROVIDER_SITE_OTHER): Payer: Federal, State, Local not specified - PPO | Admitting: Family Medicine

## 2014-09-04 VITALS — BP 116/68 | HR 82 | Temp 98.9°F | Resp 17 | Ht 66.5 in | Wt 165.0 lb

## 2014-09-04 DIAGNOSIS — R079 Chest pain, unspecified: Secondary | ICD-10-CM

## 2014-09-04 DIAGNOSIS — R002 Palpitations: Secondary | ICD-10-CM | POA: Diagnosis not present

## 2014-09-04 DIAGNOSIS — R5383 Other fatigue: Secondary | ICD-10-CM

## 2014-09-04 LAB — POCT CBC
GRANULOCYTE PERCENT: 64.6 % (ref 37–80)
HEMATOCRIT: 42 % (ref 37.7–47.9)
Hemoglobin: 13.8 g/dL (ref 12.2–16.2)
LYMPH, POC: 2.1 (ref 0.6–3.4)
MCH: 26.6 pg — AB (ref 27–31.2)
MCHC: 32.8 g/dL (ref 31.8–35.4)
MCV: 81.1 fL (ref 80–97)
MID (cbc): 0.6 (ref 0–0.9)
MPV: 7.8 fL (ref 0–99.8)
POC GRANULOCYTE: 4.9 (ref 2–6.9)
POC LYMPH %: 27.8 % (ref 10–50)
POC MID %: 7.6 %M (ref 0–12)
Platelet Count, POC: 278 10*3/uL (ref 142–424)
RBC: 5.18 M/uL (ref 4.04–5.48)
RDW, POC: 12.7 %
WBC: 7.6 10*3/uL (ref 4.6–10.2)

## 2014-09-04 MED ORDER — OMEPRAZOLE 20 MG PO CPDR: 20.0000 mg | DELAYED_RELEASE_CAPSULE | Freq: Every day | ORAL | Status: DC

## 2014-09-04 NOTE — Patient Instructions (Addendum)
Your EKG and chest x-rays both appear okay. The radiologist will also look at her chest x-ray, so if they see anything of concern I will let you know. Your blood count looked normal here in the office, but will also send off the sedimentation rate to look for inflammation, as well as the thyroid test as discussed. You can try zantac over the counter, or the prescribed omeprazole one per day to see if these symptoms may be related to heartburn, as well as temporary decrease in upper body training to see if this could be coming from the chest wall or muscles in the chest.  If pain persists next 5-7 days, let me know and we can have a cardiologist evaluate you. Return to the clinic or go to the nearest emergency room if any of your symptoms worsen or new symptoms occur.  Chest Pain (Nonspecific) It is often hard to give a specific diagnosis for the cause of chest pain. There is always a chance that your pain could be related to something serious, such as a heart attack or a blood clot in the lungs. You need to follow up with your health care provider for further evaluation. CAUSES   Heartburn.  Pneumonia or bronchitis.  Anxiety or stress.  Inflammation around your heart (pericarditis) or lung (pleuritis or pleurisy).  A blood clot in the lung.  A collapsed lung (pneumothorax). It can develop suddenly on its own (spontaneous pneumothorax) or from trauma to the chest.  Shingles infection (herpes zoster virus). The chest wall is composed of bones, muscles, and cartilage. Any of these can be the source of the pain.  The bones can be bruised by injury.  The muscles or cartilage can be strained by coughing or overwork.  The cartilage can be affected by inflammation and become sore (costochondritis). DIAGNOSIS  Lab tests or other studies may be needed to find the cause of your pain. Your health care provider may have you take a test called an ambulatory electrocardiogram (ECG). An ECG records your  heartbeat patterns over a 24-hour period. You may also have other tests, such as:  Transthoracic echocardiogram (TTE). During echocardiography, sound waves are used to evaluate how blood flows through your heart.  Transesophageal echocardiogram (TEE).  Cardiac monitoring. This allows your health care provider to monitor your heart rate and rhythm in real time.  Holter monitor. This is a portable device that records your heartbeat and can help diagnose heart arrhythmias. It allows your health care provider to track your heart activity for several days, if needed.  Stress tests by exercise or by giving medicine that makes the heart beat faster. TREATMENT   Treatment depends on what may be causing your chest pain. Treatment may include:  Acid blockers for heartburn.  Anti-inflammatory medicine.  Pain medicine for inflammatory conditions.  Antibiotics if an infection is present.  You may be advised to change lifestyle habits. This includes stopping smoking and avoiding alcohol, caffeine, and chocolate.  You may be advised to keep your head raised (elevated) when sleeping. This reduces the chance of acid going backward from your stomach into your esophagus. Most of the time, nonspecific chest pain will improve within 2-3 days with rest and mild pain medicine.  HOME CARE INSTRUCTIONS   If antibiotics were prescribed, take them as directed. Finish them even if you start to feel better.  For the next few days, avoid physical activities that bring on chest pain. Continue physical activities as directed.  Do not use  any tobacco products, including cigarettes, chewing tobacco, or electronic cigarettes.  Avoid drinking alcohol.  Only take medicine as directed by your health care provider.  Follow your health care provider's suggestions for further testing if your chest pain does not go away.  Keep any follow-up appointments you made. If you do not go to an appointment, you could develop  lasting (chronic) problems with pain. If there is any problem keeping an appointment, call to reschedule. SEEK MEDICAL CARE IF:   Your chest pain does not go away, even after treatment.  You have a rash with blisters on your chest.  You have a fever. SEEK IMMEDIATE MEDICAL CARE IF:   You have increased chest pain or pain that spreads to your arm, neck, jaw, back, or abdomen.  You have shortness of breath.  You have an increasing cough, or you cough up blood.  You have severe back or abdominal pain.  You feel nauseous or vomit.  You have severe weakness.  You faint.  You have chills. This is an emergency. Do not wait to see if the pain will go away. Get medical help at once. Call your local emergency services (911 in [place] Do not drive yourself to the hospital. MAKE SURE YOU:   Understand these instructions.  Will watch your condition.  Will get help right away if you are not doing well or get worse. Document Released: 11/29/2004 Document Revised: 02/24/2013 Document Reviewed: 09/25/2007 [facility] Patient Information 2015 [place], [place]. This information is not intended to replace advice given to you by your health care provider. Make sure you discuss any questions you have with your health care provider.

## 2014-09-04 NOTE — Progress Notes (Addendum)
Subjective:  This chart was scribed for Ashley Ray, MD by Thea Alken, ED Scribe. This patient was seen in room 11 and the patient's care was started at 3:00 PM.   Patient ID: Ashley Snyder, female    DOB: 07-20-1970, 44 y.o.   MRN: [id number]  HPI   Chief Complaint  Patient presents with  . Chest Pain    2xweeks   HPI Comments: Ashley Snyder is a 44 y.o. female who presents to the [facility] complaining of  intermittent, diffuse chest pain that began 2 weeks ago. Pt reports chest pain initially started at rest, sitting at her desk at work. She describes the pain as pressure that radiates to the front of her shoulder and down her left arm once. Patient has not noticed triggers of chest pain including pain after eating or with changing positions. She developed a headache last week but was resolved after taking an aleve 7-8 days ago. She has also felt more fatigue within the last couple of weeks.  She is involved in bootcamp, 2 times a week, which includes a lot of arm exercise and push up but started this 9 months ago and denies new regimens. Pt takes a half of metoprolol a day for an arrhythmia. She's had intermittent palpitations of her heart racing this week and on her way to office today but could have possibly been do to being nervous. She denies SOB, cough, sweating, nausea, dizziness, light headedness, leg pain, calf pain, at risk for blood clots, heart burn, vaginal bleeding, difficulty urinating, dysuria, hematuria. Her last stress test was about a year ago which was normal She denies family hx of heart problems. She denies long car rides. She denies being a smoker.  PCP- Kandice Hams, MD   There are no active problems to display for this patient.  Past Medical History  Diagnosis Date  . Arrhythmia   . Uterine fibroid    Past Surgical History  Procedure Laterality Date  . Tubal ligation     No Known Allergies Prior to Admission  medications   Medication Sig Start Date End Date Taking? Authorizing Provider  METOPROLOL TARTRATE PO Take by mouth.   Yes Historical Provider, MD  Norethin Ace-Eth Estrad-FE (LOESTRIN 24 FE PO) Take by mouth.   Yes Historical Provider, MD   History   Social History  . Marital Status: Married    Spouse Name: N/A  . Number of Children: N/A  . Years of Education: N/A   Occupational History  . Not on file.   Social History Main Topics  . Smoking status: Never Smoker   . Smokeless tobacco: Not on file  . Alcohol Use: No  . Drug Use: No  . Sexual Activity: Not on file   Other Topics Concern  . Not on file   Social History Narrative   Review of Systems  Constitutional: Positive for fatigue. Negative for fever, chills and diaphoresis.  Respiratory: Negative for cough and shortness of breath.   Cardiovascular: Positive for chest pain and palpitations. Negative for leg swelling.  Gastrointestinal: Negative for nausea.  Genitourinary: Negative for dysuria, vaginal bleeding, vaginal discharge and difficulty urinating.  Neurological: Positive for headaches. Negative for dizziness and light-headedness.   Objective:   Physical Exam  Constitutional: She is oriented to person, place, and time. She appears well-developed and well-nourished.  HENT:  Head: Normocephalic and atraumatic.  Eyes: Conjunctivae and EOM are normal. Pupils are equal, round, and reactive to  light.  Neck: Carotid bruit is not present.  Cardiovascular: Normal rate, regular rhythm, normal heart sounds and intact distal pulses.   Pulmonary/Chest: Effort normal and breath sounds normal.  Slight left chest wall tenderness. No change in chest pain with forward lean  Abdominal: Soft. She exhibits no pulsatile midline mass. There is no tenderness.  No focal tenderness on abdomen  Musculoskeletal:  No LE edema. No calf tenderness. Negative Homan's.   Neurological: She is alert and oriented to person, place, and time.    Skin: Skin is warm and dry.  Psychiatric: She has a normal mood and affect. Her behavior is normal.  Vitals reviewed.   Filed Vitals:   09/04/14 1447  BP: 116/68  Pulse: 82  Temp: 98.9 F (37.2 C)  TempSrc: Oral  Resp: 17  Height: 5' 6.5" (1.689 m)  Weight: 165 lb (74.844 kg)  SpO2: 98%   Results for orders placed or performed in visit on 09/04/14  POCT CBC  Result Value Ref Range   WBC 7.6 4.6 - 10.2 K/uL   Lymph, poc 2.1 0.6 - 3.4   POC LYMPH PERCENT 27.8 10 - 50 %L   MID (cbc) 0.6 0 - 0.9   POC MID % 7.6 0 - 12 %M   POC Granulocyte 4.9 2 - 6.9   Granulocyte percent 64.6 37 - 80 %G   RBC 5.18 4.04 - 5.48 M/uL   Hemoglobin 13.8 12.2 - 16.2 g/dL   HCT, POC 42.0 37.7 - 47.9 %   MCV 81.1 80 - 97 fL   MCH, POC 26.6 (A) 27 - 31.2 pg   MCHC 32.8 31.8 - 35.4 g/dL   RDW, POC 12.7 %   Platelet Count, POC 278 142 - 424 K/uL   MPV 7.8 0 - 99.8 fL   EKG reading-  sinus rhythm rate of 81 no acute findings.  [facility] reading (PRIMARY) by Dr. Carlota Raspberry- no acute findings.   Assessment & Plan:   Saumya Hukill Snyder is a 44 y.o. female Chest pain, unspecified chest pain type - Plan: DG Chest 2 View, POCT CBC, Sedimentation Rate, omeprazole (PRILOSEC) 20 MG capsule  Other fatigue - Plan: TSH, Sedimentation Rate  Palpitations - Plan: EKG 12-Lead, DG Chest 2 View, TSH, POCT CBC, Sedimentation Rate  Approximately 2 week history of slight chest discomfort/pressure.  Intermittent, lasting a few seconds at a time. Atypical in nature as does not have this pain with exertion or exercise.  No known cardiac risk factors. No known DVT/PE risk factors. History of reflux in the past, but different symptoms at that time than her current symptoms.  Reassuring vitals, reassuring exam, no acute findings on chest x-Snyder or EKG.   -Check sedimentation rate but less likely pericarditis/endocarditis.   -Possible chest wall pain with boot camp exercises, versus reflux.   Decrease upper body work for next  week with exercise.  Trial of over-the-counter Zantac or omeprazole was prescribed to take once per day.  -If not improving with above in next 5-7 days, can refer to cardiology to discuss further evaluation. Return to clinic/ER precautions discussed, and handout given on chest pain as below.  Does admit to some fatigue this past week  - will check a TSH especially with her history of palpitations (but she has had these evaluated in the past including with stress testing that was normal by her report). CBC reassuring.  RTC precautions.   Meds ordered this encounter  Medications  . omeprazole (PRILOSEC) 20 MG capsule  Sig: Take 1 capsule (20 mg total) by mouth daily.    Dispense:  30 capsule    Refill:  1   Patient Instructions  Your EKG and chest x-rays both appear okay. The radiologist will also look at her chest x-Snyder, so if they see anything of concern I will let you know. Your blood count looked normal here in the office, but will also send off the sedimentation rate to look for inflammation, as well as the thyroid test as discussed. You can try zantac over the counter, or the prescribed omeprazole one per day to see if these symptoms may be related to heartburn, as well as temporary decrease in upper body training to see if this could be coming from the chest wall or muscles in the chest.  If pain persists next 5-7 days, let me know and we can have a cardiologist evaluate you. Return to the clinic or go to the nearest emergency room if any of your symptoms worsen or new symptoms occur.  Chest Pain (Nonspecific) It is often hard to give a specific diagnosis for the cause of chest pain. There is always a chance that your pain could be related to something serious, such as a heart attack or a blood clot in the lungs. You need to follow up with your health care provider for further evaluation. CAUSES   Heartburn.  Pneumonia or bronchitis.  Anxiety or stress.  Inflammation around your heart  (pericarditis) or lung (pleuritis or pleurisy).  A blood clot in the lung.  A collapsed lung (pneumothorax). It can develop suddenly on its own (spontaneous pneumothorax) or from trauma to the chest.  Shingles infection (herpes zoster virus). The chest wall is composed of bones, muscles, and cartilage. Any of these can be the source of the pain.  The bones can be bruised by injury.  The muscles or cartilage can be strained by coughing or overwork.  The cartilage can be affected by inflammation and become sore (costochondritis). DIAGNOSIS  Lab tests or other studies may be needed to find the cause of your pain. Your health care provider may have you take a test called an ambulatory electrocardiogram (ECG). An ECG records your heartbeat patterns over a 24-hour period. You may also have other tests, such as:  Transthoracic echocardiogram (TTE). During echocardiography, sound waves are used to evaluate how blood flows through your heart.  Transesophageal echocardiogram (TEE).  Cardiac monitoring. This allows your health care provider to monitor your heart rate and rhythm in real time.  Holter monitor. This is a portable device that records your heartbeat and can help diagnose heart arrhythmias. It allows your health care provider to track your heart activity for several days, if needed.  Stress tests by exercise or by giving medicine that makes the heart beat faster. TREATMENT   Treatment depends on what may be causing your chest pain. Treatment may include:  Acid blockers for heartburn.  Anti-inflammatory medicine.  Pain medicine for inflammatory conditions.  Antibiotics if an infection is present.  You may be advised to change lifestyle habits. This includes stopping smoking and avoiding alcohol, caffeine, and chocolate.  You may be advised to keep your head raised (elevated) when sleeping. This reduces the chance of acid going backward from your stomach into your  esophagus. Most of the time, nonspecific chest pain will improve within 2-3 days with rest and mild pain medicine.  HOME CARE INSTRUCTIONS   If antibiotics were prescribed, take them as directed. Finish them even  if you start to feel better.  For the next few days, avoid physical activities that bring on chest pain. Continue physical activities as directed.  Do not use any tobacco products, including cigarettes, chewing tobacco, or electronic cigarettes.  Avoid drinking alcohol.  Only take medicine as directed by your health care provider.  Follow your health care provider's suggestions for further testing if your chest pain does not go away.  Keep any follow-up appointments you made. If you do not go to an appointment, you could develop lasting (chronic) problems with pain. If there is any problem keeping an appointment, call to reschedule. SEEK MEDICAL CARE IF:   Your chest pain does not go away, even after treatment.  You have a rash with blisters on your chest.  You have a fever. SEEK IMMEDIATE MEDICAL CARE IF:   You have increased chest pain or pain that spreads to your arm, neck, jaw, back, or abdomen.  You have shortness of breath.  You have an increasing cough, or you cough up blood.  You have severe back or abdominal pain.  You feel nauseous or vomit.  You have severe weakness.  You faint.  You have chills. This is an emergency. Do not wait to see if the pain will go away. Get medical help at once. Call your local emergency services (911 in [place] Do not drive yourself to the hospital. MAKE SURE YOU:   Understand these instructions.  Will watch your condition.  Will get help right away if you are not doing well or get worse. Document Released: 11/29/2004 Document Revised: 02/24/2013 Document Reviewed: 09/25/2007 [facility] Patient Information 2015 [place], [place]. This information is not intended to replace advice given to you by your health care provider.  Make sure you discuss any questions you have with your health care provider.     I personally performed the services described in this documentation, which was scribed in my presence. The recorded information has been reviewed and considered, and addended by me as needed.

## 2014-09-05 LAB — TSH: TSH: 0.682 u[IU]/mL (ref 0.350–4.500)

## 2014-09-05 LAB — SEDIMENTATION RATE: Sed Rate: 1 mm/h (ref 0–20)

## 2014-11-16 ENCOUNTER — Other Ambulatory Visit: Payer: Self-pay | Admitting: Obstetrics and Gynecology

## 2014-11-16 DIAGNOSIS — R928 Other abnormal and inconclusive findings on diagnostic imaging of breast: Secondary | ICD-10-CM

## 2014-11-17 ENCOUNTER — Other Ambulatory Visit: Payer: Self-pay | Admitting: Obstetrics and Gynecology

## 2014-11-17 DIAGNOSIS — Z0000 Encounter for general adult medical examination without abnormal findings: Secondary | ICD-10-CM

## 2014-11-22 ENCOUNTER — Ambulatory Visit
Admission: RE | Admit: 2014-11-22 | Discharge: 2014-11-22 | Disposition: A | Discharge status: Home / Self Care | Payer: Federal, State, Local not specified - PPO | Source: Ambulatory Visit | Attending: Obstetrics and Gynecology | Admitting: Obstetrics and Gynecology

## 2014-11-22 DIAGNOSIS — R928 Other abnormal and inconclusive findings on diagnostic imaging of breast: Secondary | ICD-10-CM

## 2015-06-16 DIAGNOSIS — K080 Exfoliation of teeth due to systemic causes: Secondary | ICD-10-CM | POA: Diagnosis not present

## 2015-10-04 DIAGNOSIS — M545 Low back pain: Secondary | ICD-10-CM | POA: Diagnosis not present

## 2015-11-04 DIAGNOSIS — Z0000 Encounter for general adult medical examination without abnormal findings: Secondary | ICD-10-CM | POA: Diagnosis not present

## 2015-11-04 DIAGNOSIS — Z23 Encounter for immunization: Secondary | ICD-10-CM | POA: Diagnosis not present

## 2015-11-30 DIAGNOSIS — Z01419 Encounter for gynecological examination (general) (routine) without abnormal findings: Secondary | ICD-10-CM | POA: Diagnosis not present

## 2015-11-30 DIAGNOSIS — Z6826 Body mass index (BMI) 26.0-26.9, adult: Secondary | ICD-10-CM | POA: Diagnosis not present

## 2015-11-30 DIAGNOSIS — Z1231 Encounter for screening mammogram for malignant neoplasm of breast: Secondary | ICD-10-CM | POA: Diagnosis not present

## 2015-12-05 ENCOUNTER — Other Ambulatory Visit: Payer: Self-pay | Admitting: Obstetrics and Gynecology

## 2015-12-05 DIAGNOSIS — R928 Other abnormal and inconclusive findings on diagnostic imaging of breast: Secondary | ICD-10-CM

## 2015-12-05 DIAGNOSIS — N6311 Unspecified lump in the right breast, upper outer quadrant: Secondary | ICD-10-CM | POA: Diagnosis not present

## 2015-12-08 ENCOUNTER — Ambulatory Visit
Admission: RE | Admit: 2015-12-08 | Discharge: 2015-12-08 | Disposition: A | Discharge status: Home / Self Care | Payer: Federal, State, Local not specified - PPO | Source: Ambulatory Visit | Attending: Obstetrics and Gynecology | Admitting: Obstetrics and Gynecology

## 2015-12-08 DIAGNOSIS — R928 Other abnormal and inconclusive findings on diagnostic imaging of breast: Secondary | ICD-10-CM

## 2015-12-08 DIAGNOSIS — N6311 Unspecified lump in the right breast, upper outer quadrant: Secondary | ICD-10-CM | POA: Diagnosis not present

## 2015-12-27 DIAGNOSIS — K080 Exfoliation of teeth due to systemic causes: Secondary | ICD-10-CM | POA: Diagnosis not present

## 2016-01-03 ENCOUNTER — Other Ambulatory Visit: Payer: Self-pay | Admitting: Gastroenterology

## 2016-01-04 ENCOUNTER — Encounter (HOSPITAL_COMMUNITY): Payer: Self-pay | Admitting: *Deleted

## 2016-01-06 DIAGNOSIS — Z23 Encounter for immunization: Secondary | ICD-10-CM | POA: Diagnosis not present

## 2016-01-09 ENCOUNTER — Ambulatory Visit (HOSPITAL_COMMUNITY): Payer: Federal, State, Local not specified - PPO | Admitting: Certified Registered Nurse Anesthetist

## 2016-01-09 ENCOUNTER — Ambulatory Visit (HOSPITAL_COMMUNITY)
Admission: RE | Admit: 2016-01-09 | Discharge: 2016-01-09 | Disposition: A | Discharge status: Home / Self Care | Payer: Federal, State, Local not specified - PPO | Source: Ambulatory Visit | Attending: Gastroenterology | Admitting: Gastroenterology

## 2016-01-09 ENCOUNTER — Encounter (HOSPITAL_COMMUNITY)
Admission: RE | Disposition: A | Discharge status: Home / Self Care | Payer: Self-pay | Source: Ambulatory Visit | Attending: Gastroenterology

## 2016-01-09 ENCOUNTER — Encounter (HOSPITAL_COMMUNITY): Payer: Self-pay

## 2016-01-09 DIAGNOSIS — K589 Irritable bowel syndrome without diarrhea: Secondary | ICD-10-CM | POA: Diagnosis not present

## 2016-01-09 DIAGNOSIS — G43909 Migraine, unspecified, not intractable, without status migrainosus: Secondary | ICD-10-CM | POA: Insufficient documentation

## 2016-01-09 DIAGNOSIS — Z1211 Encounter for screening for malignant neoplasm of colon: Secondary | ICD-10-CM | POA: Diagnosis not present

## 2016-01-09 HISTORY — DX: Headache: R51

## 2016-01-09 HISTORY — PX: COLONOSCOPY WITH PROPOFOL: SHX5780

## 2016-01-09 SURGERY — COLONOSCOPY WITH PROPOFOL
Anesthesia: Monitor Anesthesia Care

## 2016-01-09 MED ORDER — LACTATED RINGERS IV SOLN
INTRAVENOUS | Status: DC | PRN
  Administered 2016-01-09: 09:00:00 via INTRAVENOUS

## 2016-01-09 MED ORDER — PROPOFOL 10 MG/ML IV BOLUS
INTRAVENOUS | Status: AC
  Filled 2016-01-09: qty 60

## 2016-01-09 MED ORDER — PROPOFOL 500 MG/50ML IV EMUL
INTRAVENOUS | Status: DC | PRN
  Administered 2016-01-09: 100 ug/kg/min via INTRAVENOUS

## 2016-01-09 MED ORDER — LIDOCAINE 2% (20 MG/ML) 5 ML SYRINGE
INTRAMUSCULAR | Status: DC | PRN
  Administered 2016-01-09: 50 mg via INTRAVENOUS

## 2016-01-09 MED ORDER — PROPOFOL 10 MG/ML IV BOLUS
INTRAVENOUS | Status: DC | PRN
  Administered 2016-01-09: 20 mg via INTRAVENOUS
  Administered 2016-01-09: 50 mg via INTRAVENOUS
  Administered 2016-01-09: 20 mg via INTRAVENOUS

## 2016-01-09 MED ORDER — LIDOCAINE 2% (20 MG/ML) 5 ML SYRINGE
INTRAMUSCULAR | Status: AC
  Filled 2016-01-09: qty 5

## 2016-01-09 SURGICAL SUPPLY — 22 items

## 2016-01-09 NOTE — Transfer of Care (Signed)
Immediate Anesthesia Transfer of Care Note  Patient: Ashley Snyder  Procedure(s) Performed: Procedure(s) with comments: COLONOSCOPY WITH PROPOFOL (N/A) - Pt has had previous Colonoscopies.  Patient Location: PACU  Anesthesia Type:MAC  Level of Consciousness: Patient easily awoken, sedated, comfortable, cooperative, following commands, responds to stimulation.   Airway & Oxygen Therapy: Patient spontaneously breathing, ventilating well, oxygen via simple oxygen mask.  Post-op Assessment: Report given to PACU RN, vital signs reviewed and stable, moving all extremities.   Post vital signs: Reviewed and stable.  Complications: No apparent anesthesia complications  Last Vitals:  Vitals:   01/09/16 0846  BP: (!) 163/86  Pulse: 90  Resp: 17  Temp: 36.7 C    Last Pain:  Vitals:   01/09/16 0846  TempSrc: Oral         Complications: No apparent anesthesia complications

## 2016-01-09 NOTE — Discharge Instructions (Signed)

## 2016-01-09 NOTE — Anesthesia Preprocedure Evaluation (Signed)
Anesthesia Evaluation  Patient identified by MRN, date of birth, ID band Patient awake    Reviewed: Allergy & Precautions, H&P , NPO status , Patient's Chart, lab work & pertinent test results  Airway Mallampati: I  TM Distance: >3 FB Neck ROM: full    Dental no notable dental hx. (+) Teeth Intact   Pulmonary neg pulmonary ROS,    Pulmonary exam normal        Cardiovascular negative cardio ROS Normal cardiovascular exam     Neuro/Psych negative psych ROS   GI/Hepatic negative GI ROS, Neg liver ROS,   Endo/Other  negative endocrine ROS  Renal/GU negative Renal ROS     Musculoskeletal   Abdominal Normal abdominal exam  (+)   Peds  Hematology negative hematology ROS (+)   Anesthesia Other Findings   Reproductive/Obstetrics negative OB ROS                             Anesthesia Physical Anesthesia Plan  ASA: II  Anesthesia Plan: MAC   Post-op Pain Management:    Induction: Intravenous  Airway Management Planned: Simple Face Mask, Nasal Cannula and Natural Airway  Additional Equipment:   Intra-op Plan:   Post-operative Plan:   Informed Consent: I have reviewed the patients History and Physical, chart, labs and discussed the procedure including the risks, benefits and alternatives for the proposed anesthesia with the patient or authorized representative who has indicated his/her understanding and acceptance.     Plan Discussed with: CRNA and Surgeon  Anesthesia Plan Comments:         Anesthesia Quick Evaluation

## 2016-01-09 NOTE — Anesthesia Postprocedure Evaluation (Signed)
Anesthesia Post Note  Patient: Ashley Snyder  Procedure(s) Performed: Procedure(s) (LRB): COLONOSCOPY WITH PROPOFOL (N/A)  Patient location during evaluation: Endoscopy Anesthesia Type: MAC Level of consciousness: awake and sedated Pain management: pain level controlled Vital Signs Assessment: post-procedure vital signs reviewed and stable Respiratory status: spontaneous breathing Cardiovascular status: stable Postop Assessment: no signs of nausea or vomiting Anesthetic complications: no     Last Vitals:  Vitals:   01/09/16 0949 01/09/16 1000  BP: 138/81 (!) 145/88  Pulse: 82 90  Resp: (!) 24 (!) 26  Temp: 36.7 C     Last Pain:  Vitals:   01/09/16 0949  TempSrc: Oral   Pain Goal:                 Ashley Snyder,Ashley Snyder

## 2016-01-09 NOTE — H&P (Signed)
Procedure: Screening colonoscopy. Normal colonoscopies were performed in November 2000 and November 2006  History: The patient is a 45 year old female born 1971-02-03. She is scheduled to undergo a screening colonoscopy today.  Past medical history: Irritable bowel syndrome. Migraine headache syndrome. Uterine fibroids.  Medication allergies: None  Family history: Mother was diagnosed with ulcerative colitis  Exam: The patient is alert and lying comfortably on the endoscopy stretcher. Abdomen is soft and nontender to palpation. Lungs are clear to auscultation. Cardiac exam reveals a regular rhythm.  Plan: Proceed with screening colonoscopy

## 2016-01-09 NOTE — Op Note (Addendum)
[facility] Patient Name: Ashley Snyder Procedure Date: 01/09/2016 MRN: [id number] Attending MD: Garlan Fair , MD Date of Birth: [birth year] CSN: [id number] Age: 44 Admit Type: Outpatient Procedure:                Colonoscopy Indications:              Screening for colorectal malignant neoplasm. Mother                            was diagnosed with ulcerative colitis. Providers:                Garlan Fair, MD, Cleda Daub, RN, [facility], Technician, Heide Scales, CRNA Referring MD:              Medicines:                Propofol per Anesthesia Complications:            No immediate complications. Estimated Blood Loss:     Estimated blood loss: none. Procedure:                Pre-Anesthesia Assessment:                           - Prior to the procedure, a History and Physical                            was performed, and patient medications and                            allergies were reviewed. The patient's tolerance of                            previous anesthesia was also reviewed. The risks                            and benefits of the procedure and the sedation                            options and risks were discussed with the patient.                            All questions were answered, and informed consent                            was obtained. Prior Anticoagulants: The patient has                            taken no previous anticoagulant or antiplatelet                            agents. ASA Grade Assessment: I - A normal, healthy  patient. After reviewing the risks and benefits,                            the patient was deemed in satisfactory condition to                            undergo the procedure.                           After obtaining informed consent, the colonoscope                            was passed under direct vision. Throughout the                             procedure, the patient's blood pressure, pulse, and                            oxygen saturations were monitored continuously. The                            was introduced through the anus and advanced to the                            the cecum, identified by appendiceal orifice and                            ileocecal valve. The colonoscopy was performed                            without difficulty. The patient tolerated the                            procedure well. The quality of the bowel                            preparation was good. The appendiceal orifice and                            the rectum were photographed. Scope In: 9:30:15 AM Scope Out: 9:46:33 AM Scope Withdrawal Time: 0 hours 11 minutes 4 seconds  Total Procedure Duration: 0 hours 16 minutes 18 seconds  Findings:      The perianal and digital rectal examinations were normal.      The entire examined colon appeared normal. Impression:               - The entire examined colon is normal.                           - No specimens collected. Moderate Sedation:      N/A- Per Anesthesia Care Recommendation:           - Patient has a contact number available for  emergencies. The signs and symptoms of potential                            delayed complications were discussed with the                            patient. Return to normal activities tomorrow.                            Written discharge instructions were provided to the                            patient.                           - Repeat colonoscopy in 10 years for screening                            purposes.                           - Resume previous diet.                           - Continue present medications. Procedure Code(s):        --- Professional ---                           [id number], Colorectal cancer screening; colonoscopy on                            individual not meeting criteria for high  risk Diagnosis Code(s):        --- Professional ---                           Z12.11, Encounter for screening for malignant                            neoplasm of colon CPT copyright 2016 [employer] Association. All rights reserved. The codes documented in this report are preliminary and upon coder review may  be revised to meet current compliance requirements. Earle Gell, MD Garlan Fair, MD 01/09/2016 9:51:05 AM This report has been signed electronically. Number of Addenda: 0

## 2016-01-10 ENCOUNTER — Encounter (HOSPITAL_COMMUNITY): Payer: Self-pay | Admitting: Gastroenterology

## 2016-02-07 DIAGNOSIS — M542 Cervicalgia: Secondary | ICD-10-CM | POA: Diagnosis not present

## 2016-02-07 DIAGNOSIS — M545 Low back pain: Secondary | ICD-10-CM | POA: Diagnosis not present

## 2016-02-08 DIAGNOSIS — M542 Cervicalgia: Secondary | ICD-10-CM | POA: Diagnosis not present

## 2016-02-08 DIAGNOSIS — M545 Low back pain: Secondary | ICD-10-CM | POA: Diagnosis not present

## 2016-02-13 DIAGNOSIS — M545 Low back pain: Secondary | ICD-10-CM | POA: Diagnosis not present

## 2016-02-13 DIAGNOSIS — M542 Cervicalgia: Secondary | ICD-10-CM | POA: Diagnosis not present

## 2016-02-20 DIAGNOSIS — M542 Cervicalgia: Secondary | ICD-10-CM | POA: Diagnosis not present

## 2016-02-20 DIAGNOSIS — M545 Low back pain: Secondary | ICD-10-CM | POA: Diagnosis not present

## 2016-02-29 DIAGNOSIS — M542 Cervicalgia: Secondary | ICD-10-CM | POA: Diagnosis not present

## 2016-02-29 DIAGNOSIS — M545 Low back pain: Secondary | ICD-10-CM | POA: Diagnosis not present

## 2016-03-07 DIAGNOSIS — M542 Cervicalgia: Secondary | ICD-10-CM | POA: Diagnosis not present

## 2016-03-07 DIAGNOSIS — M545 Low back pain: Secondary | ICD-10-CM | POA: Diagnosis not present

## 2016-06-15 DIAGNOSIS — N390 Urinary tract infection, site not specified: Secondary | ICD-10-CM | POA: Diagnosis not present

## 2016-09-13 DIAGNOSIS — M79602 Pain in left arm: Secondary | ICD-10-CM | POA: Diagnosis not present

## 2016-09-14 DIAGNOSIS — K080 Exfoliation of teeth due to systemic causes: Secondary | ICD-10-CM | POA: Diagnosis not present

## 2016-10-31 ENCOUNTER — Ambulatory Visit
Admission: RE | Admit: 2016-10-31 | Discharge: 2016-10-31 | Disposition: A | Discharge status: Home / Self Care | Payer: Federal, State, Local not specified - PPO | Source: Ambulatory Visit | Attending: Internal Medicine | Admitting: Internal Medicine

## 2016-10-31 ENCOUNTER — Other Ambulatory Visit: Payer: Self-pay | Admitting: Internal Medicine

## 2016-10-31 DIAGNOSIS — M542 Cervicalgia: Secondary | ICD-10-CM | POA: Diagnosis not present

## 2016-10-31 DIAGNOSIS — M79602 Pain in left arm: Secondary | ICD-10-CM | POA: Diagnosis not present

## 2016-11-09 DIAGNOSIS — R7301 Impaired fasting glucose: Secondary | ICD-10-CM | POA: Diagnosis not present

## 2016-11-09 DIAGNOSIS — Z0000 Encounter for general adult medical examination without abnormal findings: Secondary | ICD-10-CM | POA: Diagnosis not present

## 2016-11-09 DIAGNOSIS — I10 Essential (primary) hypertension: Secondary | ICD-10-CM | POA: Diagnosis not present

## 2017-01-10 DIAGNOSIS — Z6827 Body mass index (BMI) 27.0-27.9, adult: Secondary | ICD-10-CM | POA: Diagnosis not present

## 2017-01-10 DIAGNOSIS — Z1231 Encounter for screening mammogram for malignant neoplasm of breast: Secondary | ICD-10-CM | POA: Diagnosis not present

## 2017-01-10 DIAGNOSIS — Z01419 Encounter for gynecological examination (general) (routine) without abnormal findings: Secondary | ICD-10-CM | POA: Diagnosis not present

## 2017-01-18 DIAGNOSIS — J0100 Acute maxillary sinusitis, unspecified: Secondary | ICD-10-CM | POA: Diagnosis not present

## 2017-01-18 DIAGNOSIS — Z23 Encounter for immunization: Secondary | ICD-10-CM | POA: Diagnosis not present

## 2017-01-18 DIAGNOSIS — R030 Elevated blood-pressure reading, without diagnosis of hypertension: Secondary | ICD-10-CM | POA: Diagnosis not present

## 2017-02-18 DIAGNOSIS — R0789 Other chest pain: Secondary | ICD-10-CM | POA: Diagnosis not present

## 2017-03-05 HISTORY — PX: ROTATOR CUFF REPAIR: SHX139

## 2017-03-11 DIAGNOSIS — R51 Headache: Secondary | ICD-10-CM | POA: Diagnosis not present

## 2017-03-11 DIAGNOSIS — Z72820 Sleep deprivation: Secondary | ICD-10-CM | POA: Diagnosis not present

## 2017-03-19 DIAGNOSIS — K080 Exfoliation of teeth due to systemic causes: Secondary | ICD-10-CM | POA: Diagnosis not present

## 2017-03-25 DIAGNOSIS — R739 Hyperglycemia, unspecified: Secondary | ICD-10-CM | POA: Diagnosis not present

## 2017-04-15 DIAGNOSIS — G43719 Chronic migraine without aura, intractable, without status migrainosus: Secondary | ICD-10-CM | POA: Diagnosis not present

## 2017-04-15 DIAGNOSIS — Z049 Encounter for examination and observation for unspecified reason: Secondary | ICD-10-CM | POA: Diagnosis not present

## 2017-06-06 DIAGNOSIS — M79602 Pain in left arm: Secondary | ICD-10-CM | POA: Diagnosis not present

## 2017-07-11 DIAGNOSIS — R030 Elevated blood-pressure reading, without diagnosis of hypertension: Secondary | ICD-10-CM | POA: Diagnosis not present

## 2017-07-11 DIAGNOSIS — R509 Fever, unspecified: Secondary | ICD-10-CM | POA: Diagnosis not present

## 2017-07-11 DIAGNOSIS — H1011 Acute atopic conjunctivitis, right eye: Secondary | ICD-10-CM | POA: Diagnosis not present

## 2017-07-18 DIAGNOSIS — H1011 Acute atopic conjunctivitis, right eye: Secondary | ICD-10-CM | POA: Diagnosis not present

## 2017-08-07 DIAGNOSIS — M9901 Segmental and somatic dysfunction of cervical region: Secondary | ICD-10-CM | POA: Diagnosis not present

## 2017-08-07 DIAGNOSIS — M50122 Cervical disc disorder at C5-C6 level with radiculopathy: Secondary | ICD-10-CM | POA: Diagnosis not present

## 2017-08-07 DIAGNOSIS — M9905 Segmental and somatic dysfunction of pelvic region: Secondary | ICD-10-CM | POA: Diagnosis not present

## 2017-08-07 DIAGNOSIS — M461 Sacroiliitis, not elsewhere classified: Secondary | ICD-10-CM | POA: Diagnosis not present

## 2017-08-08 DIAGNOSIS — M461 Sacroiliitis, not elsewhere classified: Secondary | ICD-10-CM | POA: Diagnosis not present

## 2017-08-08 DIAGNOSIS — M9901 Segmental and somatic dysfunction of cervical region: Secondary | ICD-10-CM | POA: Diagnosis not present

## 2017-08-08 DIAGNOSIS — M50122 Cervical disc disorder at C5-C6 level with radiculopathy: Secondary | ICD-10-CM | POA: Diagnosis not present

## 2017-08-08 DIAGNOSIS — M9905 Segmental and somatic dysfunction of pelvic region: Secondary | ICD-10-CM | POA: Diagnosis not present

## 2017-08-08 DIAGNOSIS — H10413 Chronic giant papillary conjunctivitis, bilateral: Secondary | ICD-10-CM | POA: Diagnosis not present

## 2017-08-14 DIAGNOSIS — M50122 Cervical disc disorder at C5-C6 level with radiculopathy: Secondary | ICD-10-CM | POA: Diagnosis not present

## 2017-08-14 DIAGNOSIS — M9901 Segmental and somatic dysfunction of cervical region: Secondary | ICD-10-CM | POA: Diagnosis not present

## 2017-08-14 DIAGNOSIS — M9905 Segmental and somatic dysfunction of pelvic region: Secondary | ICD-10-CM | POA: Diagnosis not present

## 2017-08-14 DIAGNOSIS — M461 Sacroiliitis, not elsewhere classified: Secondary | ICD-10-CM | POA: Diagnosis not present

## 2017-08-16 DIAGNOSIS — H10413 Chronic giant papillary conjunctivitis, bilateral: Secondary | ICD-10-CM | POA: Diagnosis not present

## 2017-08-19 DIAGNOSIS — M9901 Segmental and somatic dysfunction of cervical region: Secondary | ICD-10-CM | POA: Diagnosis not present

## 2017-08-19 DIAGNOSIS — M50122 Cervical disc disorder at C5-C6 level with radiculopathy: Secondary | ICD-10-CM | POA: Diagnosis not present

## 2017-08-19 DIAGNOSIS — M461 Sacroiliitis, not elsewhere classified: Secondary | ICD-10-CM | POA: Diagnosis not present

## 2017-08-19 DIAGNOSIS — M9905 Segmental and somatic dysfunction of pelvic region: Secondary | ICD-10-CM | POA: Diagnosis not present

## 2017-08-26 DIAGNOSIS — M461 Sacroiliitis, not elsewhere classified: Secondary | ICD-10-CM | POA: Diagnosis not present

## 2017-08-26 DIAGNOSIS — M50122 Cervical disc disorder at C5-C6 level with radiculopathy: Secondary | ICD-10-CM | POA: Diagnosis not present

## 2017-08-26 DIAGNOSIS — M9905 Segmental and somatic dysfunction of pelvic region: Secondary | ICD-10-CM | POA: Diagnosis not present

## 2017-08-26 DIAGNOSIS — M9901 Segmental and somatic dysfunction of cervical region: Secondary | ICD-10-CM | POA: Diagnosis not present

## 2017-09-18 ENCOUNTER — Other Ambulatory Visit: Payer: Self-pay | Admitting: Nurse Practitioner

## 2017-09-18 DIAGNOSIS — M79602 Pain in left arm: Secondary | ICD-10-CM | POA: Diagnosis not present

## 2017-09-18 DIAGNOSIS — K080 Exfoliation of teeth due to systemic causes: Secondary | ICD-10-CM | POA: Diagnosis not present

## 2017-09-18 DIAGNOSIS — M542 Cervicalgia: Secondary | ICD-10-CM | POA: Diagnosis not present

## 2017-09-21 ENCOUNTER — Ambulatory Visit
Admission: RE | Admit: 2017-09-21 | Discharge: 2017-09-21 | Disposition: A | Discharge status: Home / Self Care | Payer: Federal, State, Local not specified - PPO | Source: Ambulatory Visit | Attending: Nurse Practitioner | Admitting: Nurse Practitioner

## 2017-09-21 DIAGNOSIS — M4802 Spinal stenosis, cervical region: Secondary | ICD-10-CM | POA: Diagnosis not present

## 2017-09-21 DIAGNOSIS — M79602 Pain in left arm: Secondary | ICD-10-CM

## 2017-09-21 DIAGNOSIS — M25512 Pain in left shoulder: Secondary | ICD-10-CM | POA: Diagnosis not present

## 2017-09-30 DIAGNOSIS — M5412 Radiculopathy, cervical region: Secondary | ICD-10-CM | POA: Diagnosis not present

## 2017-10-04 DIAGNOSIS — M5412 Radiculopathy, cervical region: Secondary | ICD-10-CM | POA: Diagnosis not present

## 2017-10-29 DIAGNOSIS — M5412 Radiculopathy, cervical region: Secondary | ICD-10-CM | POA: Diagnosis not present

## 2017-11-12 DIAGNOSIS — M542 Cervicalgia: Secondary | ICD-10-CM | POA: Diagnosis not present

## 2017-11-20 DIAGNOSIS — M75112 Incomplete rotator cuff tear or rupture of left shoulder, not specified as traumatic: Secondary | ICD-10-CM | POA: Diagnosis not present

## 2017-11-21 DIAGNOSIS — M67912 Unspecified disorder of synovium and tendon, left shoulder: Secondary | ICD-10-CM | POA: Diagnosis not present

## 2017-11-21 DIAGNOSIS — I10 Essential (primary) hypertension: Secondary | ICD-10-CM | POA: Diagnosis not present

## 2017-11-21 DIAGNOSIS — Z23 Encounter for immunization: Secondary | ICD-10-CM | POA: Diagnosis not present

## 2017-11-21 DIAGNOSIS — Z0000 Encounter for general adult medical examination without abnormal findings: Secondary | ICD-10-CM | POA: Diagnosis not present

## 2017-12-03 DIAGNOSIS — M7552 Bursitis of left shoulder: Secondary | ICD-10-CM | POA: Diagnosis not present

## 2017-12-03 DIAGNOSIS — M75112 Incomplete rotator cuff tear or rupture of left shoulder, not specified as traumatic: Secondary | ICD-10-CM | POA: Diagnosis not present

## 2017-12-03 DIAGNOSIS — M7542 Impingement syndrome of left shoulder: Secondary | ICD-10-CM | POA: Diagnosis not present

## 2017-12-03 DIAGNOSIS — G8918 Other acute postprocedural pain: Secondary | ICD-10-CM | POA: Diagnosis not present

## 2017-12-11 DIAGNOSIS — Z9889 Other specified postprocedural states: Secondary | ICD-10-CM | POA: Diagnosis not present

## 2017-12-19 DIAGNOSIS — M7542 Impingement syndrome of left shoulder: Secondary | ICD-10-CM | POA: Diagnosis not present

## 2017-12-19 DIAGNOSIS — M75112 Incomplete rotator cuff tear or rupture of left shoulder, not specified as traumatic: Secondary | ICD-10-CM | POA: Diagnosis not present

## 2017-12-24 DIAGNOSIS — M7542 Impingement syndrome of left shoulder: Secondary | ICD-10-CM | POA: Diagnosis not present

## 2017-12-24 DIAGNOSIS — M75112 Incomplete rotator cuff tear or rupture of left shoulder, not specified as traumatic: Secondary | ICD-10-CM | POA: Diagnosis not present

## 2017-12-26 DIAGNOSIS — M75112 Incomplete rotator cuff tear or rupture of left shoulder, not specified as traumatic: Secondary | ICD-10-CM | POA: Diagnosis not present

## 2017-12-26 DIAGNOSIS — M7542 Impingement syndrome of left shoulder: Secondary | ICD-10-CM | POA: Diagnosis not present

## 2017-12-31 DIAGNOSIS — M7542 Impingement syndrome of left shoulder: Secondary | ICD-10-CM | POA: Diagnosis not present

## 2017-12-31 DIAGNOSIS — M75112 Incomplete rotator cuff tear or rupture of left shoulder, not specified as traumatic: Secondary | ICD-10-CM | POA: Diagnosis not present

## 2018-01-02 DIAGNOSIS — M7542 Impingement syndrome of left shoulder: Secondary | ICD-10-CM | POA: Diagnosis not present

## 2018-01-02 DIAGNOSIS — M75112 Incomplete rotator cuff tear or rupture of left shoulder, not specified as traumatic: Secondary | ICD-10-CM | POA: Diagnosis not present

## 2018-01-07 DIAGNOSIS — M7542 Impingement syndrome of left shoulder: Secondary | ICD-10-CM | POA: Diagnosis not present

## 2018-01-07 DIAGNOSIS — M75112 Incomplete rotator cuff tear or rupture of left shoulder, not specified as traumatic: Secondary | ICD-10-CM | POA: Diagnosis not present

## 2018-01-09 DIAGNOSIS — M7542 Impingement syndrome of left shoulder: Secondary | ICD-10-CM | POA: Diagnosis not present

## 2018-01-09 DIAGNOSIS — M75112 Incomplete rotator cuff tear or rupture of left shoulder, not specified as traumatic: Secondary | ICD-10-CM | POA: Diagnosis not present

## 2018-01-14 DIAGNOSIS — M75112 Incomplete rotator cuff tear or rupture of left shoulder, not specified as traumatic: Secondary | ICD-10-CM | POA: Diagnosis not present

## 2018-01-14 DIAGNOSIS — M7542 Impingement syndrome of left shoulder: Secondary | ICD-10-CM | POA: Diagnosis not present

## 2018-01-15 DIAGNOSIS — Z9889 Other specified postprocedural states: Secondary | ICD-10-CM | POA: Diagnosis not present

## 2018-01-16 DIAGNOSIS — M7542 Impingement syndrome of left shoulder: Secondary | ICD-10-CM | POA: Diagnosis not present

## 2018-01-16 DIAGNOSIS — M75112 Incomplete rotator cuff tear or rupture of left shoulder, not specified as traumatic: Secondary | ICD-10-CM | POA: Diagnosis not present

## 2018-01-21 DIAGNOSIS — M75112 Incomplete rotator cuff tear or rupture of left shoulder, not specified as traumatic: Secondary | ICD-10-CM | POA: Diagnosis not present

## 2018-01-21 DIAGNOSIS — M7542 Impingement syndrome of left shoulder: Secondary | ICD-10-CM | POA: Diagnosis not present

## 2018-01-23 DIAGNOSIS — M75112 Incomplete rotator cuff tear or rupture of left shoulder, not specified as traumatic: Secondary | ICD-10-CM | POA: Diagnosis not present

## 2018-01-23 DIAGNOSIS — M7542 Impingement syndrome of left shoulder: Secondary | ICD-10-CM | POA: Diagnosis not present

## 2018-02-03 DIAGNOSIS — M75112 Incomplete rotator cuff tear or rupture of left shoulder, not specified as traumatic: Secondary | ICD-10-CM | POA: Diagnosis not present

## 2018-02-03 DIAGNOSIS — M7542 Impingement syndrome of left shoulder: Secondary | ICD-10-CM | POA: Diagnosis not present

## 2018-02-04 DIAGNOSIS — Z6828 Body mass index (BMI) 28.0-28.9, adult: Secondary | ICD-10-CM | POA: Diagnosis not present

## 2018-02-04 DIAGNOSIS — Z01419 Encounter for gynecological examination (general) (routine) without abnormal findings: Secondary | ICD-10-CM | POA: Diagnosis not present

## 2018-02-04 DIAGNOSIS — Z1231 Encounter for screening mammogram for malignant neoplasm of breast: Secondary | ICD-10-CM | POA: Diagnosis not present

## 2018-02-04 DIAGNOSIS — N760 Acute vaginitis: Secondary | ICD-10-CM | POA: Diagnosis not present

## 2018-02-05 DIAGNOSIS — M7542 Impingement syndrome of left shoulder: Secondary | ICD-10-CM | POA: Diagnosis not present

## 2018-02-05 DIAGNOSIS — M75112 Incomplete rotator cuff tear or rupture of left shoulder, not specified as traumatic: Secondary | ICD-10-CM | POA: Diagnosis not present

## 2018-02-10 DIAGNOSIS — M7542 Impingement syndrome of left shoulder: Secondary | ICD-10-CM | POA: Diagnosis not present

## 2018-02-10 DIAGNOSIS — M75112 Incomplete rotator cuff tear or rupture of left shoulder, not specified as traumatic: Secondary | ICD-10-CM | POA: Diagnosis not present

## 2018-02-12 DIAGNOSIS — M7542 Impingement syndrome of left shoulder: Secondary | ICD-10-CM | POA: Diagnosis not present

## 2018-02-12 DIAGNOSIS — M75112 Incomplete rotator cuff tear or rupture of left shoulder, not specified as traumatic: Secondary | ICD-10-CM | POA: Diagnosis not present

## 2018-02-17 DIAGNOSIS — M75112 Incomplete rotator cuff tear or rupture of left shoulder, not specified as traumatic: Secondary | ICD-10-CM | POA: Diagnosis not present

## 2018-02-17 DIAGNOSIS — M7542 Impingement syndrome of left shoulder: Secondary | ICD-10-CM | POA: Diagnosis not present

## 2018-02-19 DIAGNOSIS — M75112 Incomplete rotator cuff tear or rupture of left shoulder, not specified as traumatic: Secondary | ICD-10-CM | POA: Diagnosis not present

## 2018-02-19 DIAGNOSIS — M7542 Impingement syndrome of left shoulder: Secondary | ICD-10-CM | POA: Diagnosis not present

## 2018-02-24 DIAGNOSIS — M7542 Impingement syndrome of left shoulder: Secondary | ICD-10-CM | POA: Diagnosis not present

## 2018-02-24 DIAGNOSIS — M75112 Incomplete rotator cuff tear or rupture of left shoulder, not specified as traumatic: Secondary | ICD-10-CM | POA: Diagnosis not present

## 2018-02-28 DIAGNOSIS — M7542 Impingement syndrome of left shoulder: Secondary | ICD-10-CM | POA: Diagnosis not present

## 2018-02-28 DIAGNOSIS — M75112 Incomplete rotator cuff tear or rupture of left shoulder, not specified as traumatic: Secondary | ICD-10-CM | POA: Diagnosis not present

## 2018-03-03 DIAGNOSIS — M7542 Impingement syndrome of left shoulder: Secondary | ICD-10-CM | POA: Diagnosis not present

## 2018-03-03 DIAGNOSIS — Z9889 Other specified postprocedural states: Secondary | ICD-10-CM | POA: Diagnosis not present

## 2018-03-03 DIAGNOSIS — M75112 Incomplete rotator cuff tear or rupture of left shoulder, not specified as traumatic: Secondary | ICD-10-CM | POA: Diagnosis not present

## 2018-03-07 DIAGNOSIS — M7542 Impingement syndrome of left shoulder: Secondary | ICD-10-CM | POA: Diagnosis not present

## 2018-03-07 DIAGNOSIS — M75112 Incomplete rotator cuff tear or rupture of left shoulder, not specified as traumatic: Secondary | ICD-10-CM | POA: Diagnosis not present

## 2018-03-10 DIAGNOSIS — M75112 Incomplete rotator cuff tear or rupture of left shoulder, not specified as traumatic: Secondary | ICD-10-CM | POA: Diagnosis not present

## 2018-03-10 DIAGNOSIS — M7542 Impingement syndrome of left shoulder: Secondary | ICD-10-CM | POA: Diagnosis not present

## 2018-03-12 DIAGNOSIS — M75112 Incomplete rotator cuff tear or rupture of left shoulder, not specified as traumatic: Secondary | ICD-10-CM | POA: Diagnosis not present

## 2018-03-12 DIAGNOSIS — M7542 Impingement syndrome of left shoulder: Secondary | ICD-10-CM | POA: Diagnosis not present

## 2018-03-13 DIAGNOSIS — R000 Tachycardia, unspecified: Secondary | ICD-10-CM | POA: Diagnosis not present

## 2018-03-13 DIAGNOSIS — G8929 Other chronic pain: Secondary | ICD-10-CM | POA: Diagnosis not present

## 2018-03-13 DIAGNOSIS — M25512 Pain in left shoulder: Secondary | ICD-10-CM | POA: Diagnosis not present

## 2018-03-13 DIAGNOSIS — F419 Anxiety disorder, unspecified: Secondary | ICD-10-CM | POA: Diagnosis not present

## 2018-03-17 DIAGNOSIS — M7542 Impingement syndrome of left shoulder: Secondary | ICD-10-CM | POA: Diagnosis not present

## 2018-03-17 DIAGNOSIS — M75112 Incomplete rotator cuff tear or rupture of left shoulder, not specified as traumatic: Secondary | ICD-10-CM | POA: Diagnosis not present

## 2018-03-19 DIAGNOSIS — M75112 Incomplete rotator cuff tear or rupture of left shoulder, not specified as traumatic: Secondary | ICD-10-CM | POA: Diagnosis not present

## 2018-03-19 DIAGNOSIS — M7542 Impingement syndrome of left shoulder: Secondary | ICD-10-CM | POA: Diagnosis not present

## 2018-03-24 DIAGNOSIS — F411 Generalized anxiety disorder: Secondary | ICD-10-CM | POA: Diagnosis not present

## 2018-03-24 DIAGNOSIS — M75112 Incomplete rotator cuff tear or rupture of left shoulder, not specified as traumatic: Secondary | ICD-10-CM | POA: Diagnosis not present

## 2018-03-24 DIAGNOSIS — Z809 Family history of malignant neoplasm, unspecified: Secondary | ICD-10-CM | POA: Diagnosis not present

## 2018-03-24 DIAGNOSIS — M7542 Impingement syndrome of left shoulder: Secondary | ICD-10-CM | POA: Diagnosis not present

## 2018-03-25 DIAGNOSIS — K080 Exfoliation of teeth due to systemic causes: Secondary | ICD-10-CM | POA: Diagnosis not present

## 2018-03-31 DIAGNOSIS — F411 Generalized anxiety disorder: Secondary | ICD-10-CM | POA: Diagnosis not present

## 2018-04-02 DIAGNOSIS — Z09 Encounter for follow-up examination after completed treatment for conditions other than malignant neoplasm: Secondary | ICD-10-CM | POA: Diagnosis not present

## 2018-04-02 DIAGNOSIS — M75112 Incomplete rotator cuff tear or rupture of left shoulder, not specified as traumatic: Secondary | ICD-10-CM | POA: Diagnosis not present

## 2018-04-02 DIAGNOSIS — M7542 Impingement syndrome of left shoulder: Secondary | ICD-10-CM | POA: Diagnosis not present

## 2018-04-07 DIAGNOSIS — F411 Generalized anxiety disorder: Secondary | ICD-10-CM | POA: Diagnosis not present

## 2018-04-08 DIAGNOSIS — G8918 Other acute postprocedural pain: Secondary | ICD-10-CM | POA: Diagnosis not present

## 2018-04-08 DIAGNOSIS — M7502 Adhesive capsulitis of left shoulder: Secondary | ICD-10-CM | POA: Diagnosis not present

## 2018-04-09 DIAGNOSIS — M25612 Stiffness of left shoulder, not elsewhere classified: Secondary | ICD-10-CM | POA: Diagnosis not present

## 2018-04-09 DIAGNOSIS — M6281 Muscle weakness (generalized): Secondary | ICD-10-CM | POA: Diagnosis not present

## 2018-04-10 DIAGNOSIS — M6281 Muscle weakness (generalized): Secondary | ICD-10-CM | POA: Diagnosis not present

## 2018-04-10 DIAGNOSIS — M25612 Stiffness of left shoulder, not elsewhere classified: Secondary | ICD-10-CM | POA: Diagnosis not present

## 2018-04-11 DIAGNOSIS — M6281 Muscle weakness (generalized): Secondary | ICD-10-CM | POA: Diagnosis not present

## 2018-04-11 DIAGNOSIS — M25612 Stiffness of left shoulder, not elsewhere classified: Secondary | ICD-10-CM | POA: Diagnosis not present

## 2018-04-14 DIAGNOSIS — F411 Generalized anxiety disorder: Secondary | ICD-10-CM | POA: Diagnosis not present

## 2018-04-16 DIAGNOSIS — M25612 Stiffness of left shoulder, not elsewhere classified: Secondary | ICD-10-CM | POA: Diagnosis not present

## 2018-04-16 DIAGNOSIS — M6281 Muscle weakness (generalized): Secondary | ICD-10-CM | POA: Diagnosis not present

## 2018-04-17 DIAGNOSIS — M6281 Muscle weakness (generalized): Secondary | ICD-10-CM | POA: Diagnosis not present

## 2018-04-17 DIAGNOSIS — M25612 Stiffness of left shoulder, not elsewhere classified: Secondary | ICD-10-CM | POA: Diagnosis not present

## 2018-04-18 DIAGNOSIS — M75112 Incomplete rotator cuff tear or rupture of left shoulder, not specified as traumatic: Secondary | ICD-10-CM | POA: Diagnosis not present

## 2018-04-18 DIAGNOSIS — M25612 Stiffness of left shoulder, not elsewhere classified: Secondary | ICD-10-CM | POA: Diagnosis not present

## 2018-04-18 DIAGNOSIS — Z9889 Other specified postprocedural states: Secondary | ICD-10-CM | POA: Diagnosis not present

## 2018-04-18 DIAGNOSIS — M6281 Muscle weakness (generalized): Secondary | ICD-10-CM | POA: Diagnosis not present

## 2018-04-21 DIAGNOSIS — M6281 Muscle weakness (generalized): Secondary | ICD-10-CM | POA: Diagnosis not present

## 2018-04-21 DIAGNOSIS — M25612 Stiffness of left shoulder, not elsewhere classified: Secondary | ICD-10-CM | POA: Diagnosis not present

## 2018-04-22 DIAGNOSIS — M6281 Muscle weakness (generalized): Secondary | ICD-10-CM | POA: Diagnosis not present

## 2018-04-22 DIAGNOSIS — M25612 Stiffness of left shoulder, not elsewhere classified: Secondary | ICD-10-CM | POA: Diagnosis not present

## 2018-04-25 DIAGNOSIS — M6281 Muscle weakness (generalized): Secondary | ICD-10-CM | POA: Diagnosis not present

## 2018-04-25 DIAGNOSIS — M25612 Stiffness of left shoulder, not elsewhere classified: Secondary | ICD-10-CM | POA: Diagnosis not present

## 2018-04-28 DIAGNOSIS — M25612 Stiffness of left shoulder, not elsewhere classified: Secondary | ICD-10-CM | POA: Diagnosis not present

## 2018-04-28 DIAGNOSIS — M6281 Muscle weakness (generalized): Secondary | ICD-10-CM | POA: Diagnosis not present

## 2018-05-02 DIAGNOSIS — M6281 Muscle weakness (generalized): Secondary | ICD-10-CM | POA: Diagnosis not present

## 2018-05-02 DIAGNOSIS — M25612 Stiffness of left shoulder, not elsewhere classified: Secondary | ICD-10-CM | POA: Diagnosis not present

## 2018-05-07 DIAGNOSIS — M25612 Stiffness of left shoulder, not elsewhere classified: Secondary | ICD-10-CM | POA: Diagnosis not present

## 2018-05-07 DIAGNOSIS — M6281 Muscle weakness (generalized): Secondary | ICD-10-CM | POA: Diagnosis not present

## 2018-05-12 DIAGNOSIS — M6281 Muscle weakness (generalized): Secondary | ICD-10-CM | POA: Diagnosis not present

## 2018-05-12 DIAGNOSIS — M25612 Stiffness of left shoulder, not elsewhere classified: Secondary | ICD-10-CM | POA: Diagnosis not present

## 2018-05-16 DIAGNOSIS — M25612 Stiffness of left shoulder, not elsewhere classified: Secondary | ICD-10-CM | POA: Diagnosis not present

## 2018-05-16 DIAGNOSIS — M6281 Muscle weakness (generalized): Secondary | ICD-10-CM | POA: Diagnosis not present

## 2018-05-21 DIAGNOSIS — M6281 Muscle weakness (generalized): Secondary | ICD-10-CM | POA: Diagnosis not present

## 2018-05-21 DIAGNOSIS — M25612 Stiffness of left shoulder, not elsewhere classified: Secondary | ICD-10-CM | POA: Diagnosis not present

## 2018-05-28 DIAGNOSIS — M6281 Muscle weakness (generalized): Secondary | ICD-10-CM | POA: Diagnosis not present

## 2018-05-28 DIAGNOSIS — M25612 Stiffness of left shoulder, not elsewhere classified: Secondary | ICD-10-CM | POA: Diagnosis not present

## 2018-08-20 DIAGNOSIS — L668 Other cicatricial alopecia: Secondary | ICD-10-CM | POA: Diagnosis not present

## 2018-08-21 DIAGNOSIS — M545 Low back pain: Secondary | ICD-10-CM | POA: Diagnosis not present

## 2018-08-21 DIAGNOSIS — M542 Cervicalgia: Secondary | ICD-10-CM | POA: Diagnosis not present

## 2018-08-29 DIAGNOSIS — R591 Generalized enlarged lymph nodes: Secondary | ICD-10-CM | POA: Diagnosis not present

## 2018-08-29 DIAGNOSIS — M545 Low back pain: Secondary | ICD-10-CM | POA: Diagnosis not present

## 2018-08-29 DIAGNOSIS — M25512 Pain in left shoulder: Secondary | ICD-10-CM | POA: Diagnosis not present

## 2018-08-29 DIAGNOSIS — M542 Cervicalgia: Secondary | ICD-10-CM | POA: Diagnosis not present

## 2018-09-04 DIAGNOSIS — M25512 Pain in left shoulder: Secondary | ICD-10-CM | POA: Diagnosis not present

## 2018-09-04 DIAGNOSIS — M542 Cervicalgia: Secondary | ICD-10-CM | POA: Diagnosis not present

## 2018-09-04 DIAGNOSIS — M545 Low back pain: Secondary | ICD-10-CM | POA: Diagnosis not present

## 2018-09-12 DIAGNOSIS — I10 Essential (primary) hypertension: Secondary | ICD-10-CM | POA: Diagnosis not present

## 2018-09-12 DIAGNOSIS — M542 Cervicalgia: Secondary | ICD-10-CM | POA: Diagnosis not present

## 2018-09-16 DIAGNOSIS — M25512 Pain in left shoulder: Secondary | ICD-10-CM | POA: Diagnosis not present

## 2018-09-16 DIAGNOSIS — M545 Low back pain: Secondary | ICD-10-CM | POA: Diagnosis not present

## 2018-09-16 DIAGNOSIS — M542 Cervicalgia: Secondary | ICD-10-CM | POA: Diagnosis not present

## 2018-09-26 DIAGNOSIS — M545 Low back pain: Secondary | ICD-10-CM | POA: Diagnosis not present

## 2018-09-26 DIAGNOSIS — M25512 Pain in left shoulder: Secondary | ICD-10-CM | POA: Diagnosis not present

## 2018-09-26 DIAGNOSIS — M542 Cervicalgia: Secondary | ICD-10-CM | POA: Diagnosis not present

## 2018-09-30 DIAGNOSIS — M545 Low back pain: Secondary | ICD-10-CM | POA: Diagnosis not present

## 2018-09-30 DIAGNOSIS — M25512 Pain in left shoulder: Secondary | ICD-10-CM | POA: Diagnosis not present

## 2018-09-30 DIAGNOSIS — M542 Cervicalgia: Secondary | ICD-10-CM | POA: Diagnosis not present

## 2018-09-30 DIAGNOSIS — K080 Exfoliation of teeth due to systemic causes: Secondary | ICD-10-CM | POA: Diagnosis not present

## 2018-10-09 DIAGNOSIS — M25512 Pain in left shoulder: Secondary | ICD-10-CM | POA: Diagnosis not present

## 2018-10-09 DIAGNOSIS — M542 Cervicalgia: Secondary | ICD-10-CM | POA: Diagnosis not present

## 2018-10-09 DIAGNOSIS — M545 Low back pain: Secondary | ICD-10-CM | POA: Diagnosis not present

## 2018-10-22 DIAGNOSIS — M545 Low back pain: Secondary | ICD-10-CM | POA: Diagnosis not present

## 2018-10-22 DIAGNOSIS — M25512 Pain in left shoulder: Secondary | ICD-10-CM | POA: Diagnosis not present

## 2018-10-22 DIAGNOSIS — M542 Cervicalgia: Secondary | ICD-10-CM | POA: Diagnosis not present

## 2018-10-28 DIAGNOSIS — H9202 Otalgia, left ear: Secondary | ICD-10-CM | POA: Diagnosis not present

## 2018-10-28 DIAGNOSIS — H93A2 Pulsatile tinnitus, left ear: Secondary | ICD-10-CM | POA: Diagnosis not present

## 2018-10-31 DIAGNOSIS — M25512 Pain in left shoulder: Secondary | ICD-10-CM | POA: Diagnosis not present

## 2018-10-31 DIAGNOSIS — M545 Low back pain: Secondary | ICD-10-CM | POA: Diagnosis not present

## 2018-10-31 DIAGNOSIS — M542 Cervicalgia: Secondary | ICD-10-CM | POA: Diagnosis not present

## 2018-11-04 DIAGNOSIS — F4323 Adjustment disorder with mixed anxiety and depressed mood: Secondary | ICD-10-CM | POA: Diagnosis not present

## 2018-11-06 DIAGNOSIS — M542 Cervicalgia: Secondary | ICD-10-CM | POA: Diagnosis not present

## 2018-11-06 DIAGNOSIS — M25512 Pain in left shoulder: Secondary | ICD-10-CM | POA: Diagnosis not present

## 2018-11-06 DIAGNOSIS — M545 Low back pain: Secondary | ICD-10-CM | POA: Diagnosis not present

## 2018-11-11 DIAGNOSIS — F4323 Adjustment disorder with mixed anxiety and depressed mood: Secondary | ICD-10-CM | POA: Diagnosis not present

## 2018-11-13 DIAGNOSIS — M25512 Pain in left shoulder: Secondary | ICD-10-CM | POA: Diagnosis not present

## 2018-11-13 DIAGNOSIS — M545 Low back pain: Secondary | ICD-10-CM | POA: Diagnosis not present

## 2018-11-13 DIAGNOSIS — M542 Cervicalgia: Secondary | ICD-10-CM | POA: Diagnosis not present

## 2018-11-18 DIAGNOSIS — F4323 Adjustment disorder with mixed anxiety and depressed mood: Secondary | ICD-10-CM | POA: Diagnosis not present

## 2018-11-25 DIAGNOSIS — F4323 Adjustment disorder with mixed anxiety and depressed mood: Secondary | ICD-10-CM | POA: Diagnosis not present

## 2018-12-02 DIAGNOSIS — F4323 Adjustment disorder with mixed anxiety and depressed mood: Secondary | ICD-10-CM | POA: Diagnosis not present

## 2018-12-04 DIAGNOSIS — M25512 Pain in left shoulder: Secondary | ICD-10-CM | POA: Diagnosis not present

## 2018-12-04 DIAGNOSIS — M545 Low back pain: Secondary | ICD-10-CM | POA: Diagnosis not present

## 2018-12-04 DIAGNOSIS — M542 Cervicalgia: Secondary | ICD-10-CM | POA: Diagnosis not present

## 2018-12-12 DIAGNOSIS — M25512 Pain in left shoulder: Secondary | ICD-10-CM | POA: Diagnosis not present

## 2018-12-12 DIAGNOSIS — M545 Low back pain: Secondary | ICD-10-CM | POA: Diagnosis not present

## 2018-12-12 DIAGNOSIS — M542 Cervicalgia: Secondary | ICD-10-CM | POA: Diagnosis not present

## 2018-12-12 DIAGNOSIS — R739 Hyperglycemia, unspecified: Secondary | ICD-10-CM | POA: Diagnosis not present

## 2018-12-12 DIAGNOSIS — Z13220 Encounter for screening for lipoid disorders: Secondary | ICD-10-CM | POA: Diagnosis not present

## 2018-12-12 DIAGNOSIS — Z23 Encounter for immunization: Secondary | ICD-10-CM | POA: Diagnosis not present

## 2018-12-12 DIAGNOSIS — I10 Essential (primary) hypertension: Secondary | ICD-10-CM | POA: Diagnosis not present

## 2018-12-12 DIAGNOSIS — Z0000 Encounter for general adult medical examination without abnormal findings: Secondary | ICD-10-CM | POA: Diagnosis not present

## 2018-12-15 DIAGNOSIS — M542 Cervicalgia: Secondary | ICD-10-CM | POA: Diagnosis not present

## 2018-12-15 DIAGNOSIS — M25512 Pain in left shoulder: Secondary | ICD-10-CM | POA: Diagnosis not present

## 2018-12-15 DIAGNOSIS — M545 Low back pain: Secondary | ICD-10-CM | POA: Diagnosis not present

## 2018-12-25 DIAGNOSIS — M542 Cervicalgia: Secondary | ICD-10-CM | POA: Diagnosis not present

## 2018-12-25 DIAGNOSIS — M545 Low back pain: Secondary | ICD-10-CM | POA: Diagnosis not present

## 2018-12-25 DIAGNOSIS — M25512 Pain in left shoulder: Secondary | ICD-10-CM | POA: Diagnosis not present

## 2019-01-06 DIAGNOSIS — M542 Cervicalgia: Secondary | ICD-10-CM | POA: Diagnosis not present

## 2019-01-06 DIAGNOSIS — M25512 Pain in left shoulder: Secondary | ICD-10-CM | POA: Diagnosis not present

## 2019-01-06 DIAGNOSIS — M545 Low back pain: Secondary | ICD-10-CM | POA: Diagnosis not present

## 2019-01-08 DIAGNOSIS — F4323 Adjustment disorder with mixed anxiety and depressed mood: Secondary | ICD-10-CM | POA: Diagnosis not present

## 2019-01-15 DIAGNOSIS — F4323 Adjustment disorder with mixed anxiety and depressed mood: Secondary | ICD-10-CM | POA: Diagnosis not present

## 2019-01-20 ENCOUNTER — Encounter: Payer: Federal, State, Local not specified - PPO | Attending: Internal Medicine | Admitting: Registered"

## 2019-01-20 ENCOUNTER — Encounter: Payer: Self-pay | Admitting: Registered"

## 2019-01-20 ENCOUNTER — Other Ambulatory Visit: Payer: Self-pay

## 2019-01-20 DIAGNOSIS — E119 Type 2 diabetes mellitus without complications: Secondary | ICD-10-CM

## 2019-01-20 DIAGNOSIS — Z713 Dietary counseling and surveillance: Secondary | ICD-10-CM | POA: Insufficient documentation

## 2019-01-20 NOTE — Progress Notes (Signed)
Diabetes Self-Management Education  Visit Type:  First/Initial  Appt. Start Time: 8:05 Appt. End Time: 9:40  01/20/2019  Ashley Snyder, identified by name and date of birth, is a 48 y.o. female with a diagnosis of Diabetes: Type 2.   ASSESSMENT  Pt expectations: wants to know what it means, alcohol limitations, what to eat, questions about testing  Pt arrives stating that her job is stressful. States she is unable to take lunch at work sometimes which interferes with her taking her medications sometimes. States she has been stressed since being diagnosed with diabetes because she feels like she was left hanging when given the diagnosis. States she is working  5-7 days/week, 10+ hr/days. States she is usually rushing in the morning but needs something to microwave.   States she has been checking her BS but unsure about how to do it and when. Checked BS number during today's appt: 131.   There were no vitals taken for this visit. There is no height or weight on file to calculate BMI.   Diabetes Self-Management Education - 01/20/19 0809      Health Coping   How would you rate your overall health?  Fair      Psychosocial Assessment   Patient Belief/Attitude about Diabetes  Other (comment)   stress   Self-care barriers  None    Self-management support  Friends;Family    Special Needs  None    Preferred Learning Style  No preference indicated    Learning Readiness  Ready      Complications   Last HgB A1C per patient/outside source  7.2 %    How often do you check your blood sugar?  0 times/day (not testing)    Number of hypoglycemic episodes per month  4    Can you tell when your blood sugar is low?  Yes    Have you had a dilated eye exam in the past 12 months?  No    Have you had a dental exam in the past 12 months?  Yes    Are you checking your feet?  No      Dietary Intake   Breakfast  skips; peanut butter and crackers or smoothie or [employer]-buttermilk  chicken biscuit + 1/2 hashbrown + mocha frappe    Snack (morning)  a few bites buttermilk chicken biscuit    Lunch  skips    Snack (afternoon)  skips    Dinner  [employer]-buttermilk chicken biscuit + water    Snack (evening)  1/2 fried pork chop + collard greens + mac and cheese + apple Izze water + sugar cookie with icing + Minute Maid fruit juice    Beverage(s)  water, fruit juice,      Exercise   Exercise Type  ADL's    How many days per week to you exercise?  0    How many minutes per day do you exercise?  0    Total minutes per week of exercise  0      Patient Education   Previous Diabetes Education  Yes (please comment)       Learning Objective:  Patient will have a greater understanding of diabetes self-management. Patient education plan is to attend individual and/or group sessions per assessed needs and concerns.   Plan:   Patient Instructions  Goals:  Follow Diabetes Meal Plan as instructed  Eat 3 meals   Try to have breakfast that includes carbohydrates + protein   Have lunch and dinner  with 1/2 plate non-starchy vegetables + 1/4 plate protein + 1/4 plate protein. See page 17 as guide.   Add lean protein foods to meals/snacks  Monitor glucose levels as instructed by your doctor.   Check blood sugar daily: fasting and after meals  Aim for 30 mins of physical activity daily  Bring food record and glucose log to your next nutrition visit     Expected Outcomes:     Education material provided: ADA - How to Thrive: A Guide for Your Journey with Diabetes  If problems or questions, patient to contact team via:  Phone and Email  Future DSME appointment: -

## 2019-01-20 NOTE — Patient Instructions (Addendum)
Goals:  Follow Diabetes Meal Plan as instructed  Eat 3 meals   Try to have breakfast that includes carbohydrates + protein   Have lunch and dinner with 1/2 plate non-starchy vegetables + 1/4 plate protein + 1/4 plate protein. See page 17 as guide.   Add lean protein foods to meals/snacks  Monitor glucose levels as instructed by your doctor.   Check blood sugar daily: fasting and after meals  Aim for 30 mins of physical activity daily  Bring food record and glucose log to your next nutrition visit

## 2019-01-22 DIAGNOSIS — F4323 Adjustment disorder with mixed anxiety and depressed mood: Secondary | ICD-10-CM | POA: Diagnosis not present

## 2019-01-26 DIAGNOSIS — F4323 Adjustment disorder with mixed anxiety and depressed mood: Secondary | ICD-10-CM | POA: Diagnosis not present

## 2019-02-04 DIAGNOSIS — M546 Pain in thoracic spine: Secondary | ICD-10-CM | POA: Diagnosis not present

## 2019-02-09 DIAGNOSIS — Z01419 Encounter for gynecological examination (general) (routine) without abnormal findings: Secondary | ICD-10-CM | POA: Diagnosis not present

## 2019-02-09 DIAGNOSIS — Z6828 Body mass index (BMI) 28.0-28.9, adult: Secondary | ICD-10-CM | POA: Diagnosis not present

## 2019-02-10 DIAGNOSIS — Z1231 Encounter for screening mammogram for malignant neoplasm of breast: Secondary | ICD-10-CM | POA: Diagnosis not present

## 2019-02-12 ENCOUNTER — Encounter: Payer: Federal, State, Local not specified - PPO | Attending: Internal Medicine | Admitting: Registered"

## 2019-02-12 ENCOUNTER — Encounter: Payer: Self-pay | Admitting: Registered"

## 2019-02-12 ENCOUNTER — Other Ambulatory Visit: Payer: Self-pay

## 2019-02-12 DIAGNOSIS — Z713 Dietary counseling and surveillance: Secondary | ICD-10-CM | POA: Insufficient documentation

## 2019-02-12 DIAGNOSIS — E119 Type 2 diabetes mellitus without complications: Secondary | ICD-10-CM | POA: Insufficient documentation

## 2019-02-12 NOTE — Progress Notes (Signed)
Diabetes Self-Management Education  Visit Type: Follow-up  Appt. Start Time: 9:15 Appt. End Time: 9:57  02/12/2019  Ashley Snyder, identified by name and date of birth, is a 48 y.o. female with a diagnosis of Diabetes: Type 2.   ASSESSMENT  Pt states she checks BS most days once day: FBS (118-145) and after dinner (163-186). Pt reports sometimes she feels unusual at work but unsure if its blood sugar-related or work-related. Pt was advised to check BS any time she feels unusual to help with knowing how to proceed forward. Reports she had an unexpected week off work last week due to back pain.   There were no vitals taken for this visit. There is no height or weight on file to calculate BMI.  Diabetes Self-Management Education - 02/12/19 0916      Visit Information   Visit Type  Follow-up      Initial Visit   Diabetes Type  Type 2    Are you currently following a meal plan?  No    Are you taking your medications as prescribed?  Yes    Date Diagnosed  Oct [id number]      Complications   Last HgB A1C per patient/outside source  7.2 %    How often do you check your blood sugar?  1-2 times/day    Fasting Blood glucose range (mg/dL)  70-129;130-179    Postprandial Blood glucose range (mg/dL)  130-179;180-200    Number of hypoglycemic episodes per month  0    Number of hyperglycemic episodes per week  0      Dietary Intake   Breakfast  french vanilla yogurt + strawberries + water    Snack (morning)  none    Lunch  leftovers (1/2 fried pork tenderloin + biscuit with grape jelly + 2 Bo' rounds)    Snack (afternoon)  none    Dinner  4 sesame chicken nuggets + leftover stuffed salmon + 12 oz cranberry juice    Snack (evening)  a few Dorito's    Beverage(s)  cranberry juice, water (24 oz)      Exercise   Exercise Type  ADL's    How many days per week to you exercise?  0    How many minutes per day do you exercise?  0    Total minutes per week of exercise  0      Patient Education   Previous Diabetes Education  No    Disease state   Definition of diabetes, type 1 and 2, and the diagnosis of diabetes    Nutrition management   Role of diet in the treatment of diabetes and the relationship between the three main macronutrients and blood glucose level;Food label reading, portion sizes and measuring food.;Reviewed blood glucose goals for pre and post meals and how to evaluate the patients' food intake on their blood glucose level.;Information on hints to eating out and maintain blood glucose control.    Physical activity and exercise   Role of exercise on diabetes management, blood pressure control and cardiac health.    Monitoring  Purpose and frequency of SMBG.;Taught/discussed recording of test results and interpretation of SMBG.;Interpreting lab values - A1C, lipid, urine microalbumina.;Identified appropriate SMBG and/or A1C goals.;Daily foot exams;Yearly dilated eye exam    Acute complications  Taught treatment of hypoglycemia - the 15 rule.;Discussed and identified patients' treatment of hyperglycemia.    Chronic complications  Relationship between chronic complications and blood glucose control;Assessed and discussed foot care and  prevention of foot problems;Lipid levels, blood glucose control and heart disease;Identified and discussed with patient  current chronic complications;Retinopathy and reason for yearly dilated eye exams;Nephropathy, what it is, prevention of, the use of ACE, ARB's and early detection of through urine microalbumia.;Reviewed with patient heart disease, higher risk of, and prevention    Psychosocial adjustment  Worked with patient to identify barriers to care and solutions;Role of stress on diabetes    Personal strategies to promote health  Lifestyle issues that need to be addressed for better diabetes care      Individualized Goals (developed by patient)   Nutrition  General guidelines for healthy choices and portions discussed;Other  (comment)   make 1/2 plate non-starchy vegetables for lunch and dinner   Physical Activity  Exercise 1-2 times per week;15 minutes per day    Medications  take my medication as prescribed    Monitoring   test my blood glucose as discussed    Reducing Risk  examine blood glucose patterns;do foot checks daily;treat hypoglycemia with 15 grams of carbs if blood glucose less than 70mg /dL      Post-Education Assessment   Patient understands the diabetes disease and treatment process.  Demonstrates understanding / competency    Patient understands incorporating nutritional management into lifestyle.  Demonstrates understanding / competency    Patient undertands incorporating physical activity into lifestyle.  Demonstrates understanding / competency    Patient understands using medications safely.  Demonstrates understanding / competency    Patient understands monitoring blood glucose, interpreting and using results  Demonstrates understanding / competency    Patient understands prevention, detection, and treatment of acute complications.  Demonstrates understanding / competency    Patient understands prevention, detection, and treatment of chronic complications.  Demonstrates understanding / competency    Patient understands how to develop strategies to address psychosocial issues.  Demonstrates understanding / competency    Patient understands how to develop strategies to promote health/change behavior.  Demonstrates understanding / competency      Outcomes   Expected Outcomes  Demonstrated interest in learning. Expect positive outcomes    Future DMSE  Yearly    Program Status  Completed      Subsequent Visit   Since your last visit have you continued or begun to take your medications as prescribed?  Yes    Since your last visit have you had your blood pressure checked?  Yes    Is your most recent blood pressure lower, unchanged, or higher since your last visit?  Higher    Since your last visit  have you experienced any weight changes?  Loss    Since your last visit, are you checking your blood glucose at least once a day?  No   has been trying to do it daily but has missed a few days      Individualized Plan for Diabetes Self-Management Training:   Learning Objective:  Patient will have a greater understanding of diabetes self-management. Patient education plan is to attend individual and/or group sessions per assessed needs and concerns.   Plan:   Patient Instructions  - Continue to try to have 1/2 plate of non-starchy vegetables with lunch and dinner.   - Check blood sugar when feeling unusual to see what is going on.   - Continue to keep up the great work trying to check blood sugars daily.   - Follow up at least once a year.    Expected Outcomes:  Demonstrated interest in learning. Expect positive outcomes  Education material provided: ADA - How to Thrive: A Guide for Your Journey with Diabetes  If problems or questions, patient to contact team via:  Phone and Email  Future DSME appointment: Yearly

## 2019-02-12 NOTE — Patient Instructions (Addendum)
-   Continue to try to have 1/2 plate of non-starchy vegetables with lunch and dinner.   - Check blood sugar when feeling unusual to see what is going on.   - Continue to keep up the great work trying to check blood sugars daily.   - Follow up at least once a year.

## 2019-02-18 DIAGNOSIS — R350 Frequency of micturition: Secondary | ICD-10-CM | POA: Diagnosis not present

## 2019-02-21 DIAGNOSIS — F4323 Adjustment disorder with mixed anxiety and depressed mood: Secondary | ICD-10-CM | POA: Diagnosis not present

## 2019-03-04 DIAGNOSIS — R300 Dysuria: Secondary | ICD-10-CM | POA: Diagnosis not present

## 2019-04-01 DIAGNOSIS — F4323 Adjustment disorder with mixed anxiety and depressed mood: Secondary | ICD-10-CM | POA: Diagnosis not present

## 2019-04-07 DIAGNOSIS — M7752 Other enthesopathy of left foot: Secondary | ICD-10-CM | POA: Diagnosis not present

## 2019-04-16 DIAGNOSIS — F4323 Adjustment disorder with mixed anxiety and depressed mood: Secondary | ICD-10-CM | POA: Diagnosis not present

## 2019-04-20 DIAGNOSIS — M542 Cervicalgia: Secondary | ICD-10-CM | POA: Diagnosis not present

## 2019-04-20 DIAGNOSIS — E1169 Type 2 diabetes mellitus with other specified complication: Secondary | ICD-10-CM | POA: Diagnosis not present

## 2019-04-20 DIAGNOSIS — I10 Essential (primary) hypertension: Secondary | ICD-10-CM | POA: Diagnosis not present

## 2019-04-20 DIAGNOSIS — J302 Other seasonal allergic rhinitis: Secondary | ICD-10-CM | POA: Diagnosis not present

## 2019-04-30 DIAGNOSIS — F4323 Adjustment disorder with mixed anxiety and depressed mood: Secondary | ICD-10-CM | POA: Diagnosis not present

## 2019-05-28 ENCOUNTER — Ambulatory Visit: Payer: Federal, State, Local not specified - PPO | Admitting: Pediatrics

## 2019-05-28 DIAGNOSIS — F4323 Adjustment disorder with mixed anxiety and depressed mood: Secondary | ICD-10-CM | POA: Diagnosis not present

## 2019-06-18 ENCOUNTER — Other Ambulatory Visit: Payer: Self-pay

## 2019-06-18 ENCOUNTER — Ambulatory Visit: Payer: Federal, State, Local not specified - PPO | Admitting: Allergy

## 2019-06-18 ENCOUNTER — Encounter: Payer: Self-pay | Admitting: Allergy

## 2019-06-18 VITALS — BP 132/88 | HR 94 | Temp 97.7°F | Resp 16 | Ht 67.0 in | Wt 181.4 lb

## 2019-06-18 DIAGNOSIS — F4323 Adjustment disorder with mixed anxiety and depressed mood: Secondary | ICD-10-CM | POA: Diagnosis not present

## 2019-06-18 DIAGNOSIS — J301 Allergic rhinitis due to pollen: Secondary | ICD-10-CM | POA: Diagnosis not present

## 2019-06-18 MED ORDER — AZELASTINE-FLUTICASONE 137-50 MCG/ACT NA SUSP: 1.0000 | Freq: Two times a day (BID) | NASAL | 3 refills | Status: AC

## 2019-06-18 NOTE — Progress Notes (Signed)
New Patient Note  RE: Ashley Snyder MRN: [id number] DOB: [birth year] Date of Office Visit: 06/18/2019  Referring provider: Seward Carol, MD Primary care provider: Seward Carol, MD  Chief Complaint: allergies  History of present illness: Ashley Snyder is a 49 y.o. female presenting today for consultation for allergies.    She states her ophthamologist and her dermatologist both told her she probably has allergies.  She also states her PCP states she likely also has allergies.  Thus she presents today for allergy evaluation.  She states she "always" gets a cold in Oct, Dec and March every year.  She reports her symptoms during these times include sneezing, congestion, phlegm production and ear fullness she figured it was just URIs.  But now she is thinking it is more so allergy related.   She state she has been to the her doctor during one of these episodes and has been treated for sinus infection before.  But typically she will treat with over-the-counter medications. She does state her dermatologist recommend she take zyrtec.  She has been taking zyrtec off and on.   She states she did take it daily in December but does not feel it got rid of her symptoms and she still had mucus/phlegm production.  She also states she has used Delsym.   She has performed nasal saline rinse (NetiPot) in the past and tolerated the process.  She states her bottle cracked that she does not have anything to perform the rinses with at this time.  No history of asthma, eczema or food allergy.    Review of systems: Review of Systems  Constitutional: Negative.   HENT: Negative.   Eyes: Negative.   Respiratory: Negative.   Cardiovascular: Negative.   Gastrointestinal: Negative.   Musculoskeletal: Negative.   Skin: Negative.   Neurological: Negative.     All other systems negative unless noted above in HPI  Past medical history: Past Medical History:  Diagnosis Date  .  Arrhythmia   . Diabetes mellitus without complication ([place])   . Headache   . Hypertension   . Uterine fibroid     Past surgical history: Past Surgical History:  Procedure Laterality Date  . COLONOSCOPY WITH PROPOFOL N/A 01/09/2016   Procedure: COLONOSCOPY WITH PROPOFOL;  Surgeon: Garlan Fair, MD;  Location: [facility];  Service: Endoscopy;  Laterality: N/A;  Pt has had previous Colonoscopies.  Marland Kitchen ROTATOR CUFF REPAIR  2019  . TUBAL LIGATION      Family history:  Family History  Problem Relation Age of Onset  . Colitis Mother   . Hypertension Mother   . Diabetes Other     Social history: Lives in a house with carpeting with gas heating and central cooling.  No pets in the home.  No concern for water damage, mildew or roaches in the home.  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  Medication List: Current Outpatient Medications  Medication Sig Dispense Refill  . cyclobenzaprine (FLEXERIL) 10 MG tablet Take 10 mg by mouth at bedtime.    Marland Kitchen losartan (COZAAR) 25 MG tablet Take 25 mg by mouth daily.    . metFORMIN (GLUCOPHAGE) 500 MG tablet Take by mouth 2 (two) times daily with a meal.    . METOPROLOL TARTRATE PO Take 25 mg by mouth every evening.     . norethindrone-ethinyl estradiol (JUNEL FE 1/20) 1-20 MG-MCG tablet Take 1 tablet by mouth daily.    . rosuvastatin (CRESTOR) 5 MG tablet Take  5 mg by mouth daily.    Marland Kitchen ALPRAZolam (XANAX) 0.25 MG tablet Take 0.25 mg by mouth at bedtime as needed for anxiety.    . Azelastine-Fluticasone 137-50 MCG/ACT SUSP Place 1 spray into the nose 2 (two) times daily. 23 g 3  . diclofenac (VOLTAREN) 25 MG EC tablet Take 25 mg by mouth 2 (two) times daily.    . meloxicam (MOBIC) 15 MG tablet Take 15 mg by mouth every evening.    . Norethin Ace-Eth Estrad-FE (LOESTRIN 24 FE PO) Take by mouth every evening.     Marland Kitchen omeprazole (PRILOSEC) 20 MG capsule Take 1 capsule (20 mg total) by mouth daily. (Patient not taking: Reported on 06/18/2019) 30 capsule 1    No current facility-administered medications for this visit.    Known medication allergies: No Known Allergies   Physical examination: Blood pressure 132/88, pulse 94, temperature 97.7 F (36.5 C), temperature source Temporal, resp. rate 16, height _0  (1.702 m), weight 181 lb 6.4 oz (82.3 kg), SpO2 99 %.  General: Alert, interactive, in no acute distress. HEENT: PERRLA, TMs pearly gray, turbinates non-edematous without discharge, post-pharynx non erythematous. Neck: Supple without lymphadenopathy. Lungs: Clear to auscultation without wheezing, rhonchi or rales. {no increased work of breathing. CV: Normal S1, S2 without murmurs. Abdomen: Nondistended, nontender. Skin: Warm and dry, without lesions or rashes. Extremities:  No clubbing, cyanosis or edema. Neuro:   Grossly intact.  Diagnositics/Labs:  Allergy testing: Environmental allergy skin prick testing is positive to oak.  Histamine control was positive. Intradermal testing is negative. Allergy testing results were read and interpreted by provider, documented by clinical staff.   Assessment and plan: Allergic rhinitis   -Environmental allergy skin testing today is positive for tree pollen (oak) -Allergen avoidance measures discussed/handouts provided - we discussed today allergic rhinitis (having positive testing to allergens which you do to tree pollen) vs non-allergic rhinitis (having negative testing to allergens but still exhibiting similar symptoms to allergic rhinitis) -Do recommend use of a long-acting antihistamine like Allegra 180 mg or Xyzal 5 mg. You have tried Zyrtec and it does not seem to be that effective for you. -For nasal congestion and drainage recommend trial of trial dymista 1 spray each nostril twice a day.  This is a combination nasal spray with Flonase + Astelin (nasal antihistamine).  This helps with both nasal congestion and drainage.  - recommend use nasal saline rinse (ie. Neti Pot) daily  during the times of year you normally having increased allergy symptoms (Oct, Dec, March).  Use nasal saline rinses as needed during other times of the year to help keep germs and allergens flushed out.  Provided with a rinse kit today.  Follow-up 4-6 months or sooner if needed  I appreciate the opportunity to take part in [place] care. Please do not hesitate to contact me with questions.  Sincerely,   Prudy Feeler, MD Allergy/Immunology [facility]

## 2019-06-18 NOTE — Patient Instructions (Addendum)
-  Environmental allergy skin testing today is positive for tree pollen (oak) -Allergen avoidance measures discussed/handouts provided - we discussed today allergic rhinitis (having positive testing to allergens which you do to tree pollen) vs non-allergic rhinitis (having negative testing to allergens but still exhibiting similar symptoms to allergic rhinitis) -Do recommend use of a long-acting antihistamine like Allegra 180 mg or Xyzal 5 mg. You have tried Zyrtec and it does not seem to be that effective for you. -For nasal congestion and drainage recommend trial of trial dymista 1 spray each nostril twice a day.  This is a combination nasal spray with Flonase + Astelin (nasal antihistamine).  This helps with both nasal congestion and drainage.  - recommend use nasal saline rinse (ie. Neti Pot) daily during the times of year you normally having increased allergy symptoms (Oct, Dec, March).  Use nasal saline rinses as needed during other times of the year to help keep germs and allergens flushed out.   Follow-up 4-6 months or sooner if needed

## 2019-07-09 DIAGNOSIS — F4323 Adjustment disorder with mixed anxiety and depressed mood: Secondary | ICD-10-CM | POA: Diagnosis not present

## 2019-07-12 IMAGING — CR DG CERVICAL SPINE 2 OR 3 VIEWS
3 series · 3 of 3 positions shown · non-contrast
Comparison: None.

CLINICAL DATA: Right-sided neck pain for 2 months, no known injury,
initial encounter

EXAM:
CERVICAL SPINE - 3 VIEW

[w c-spine lat]
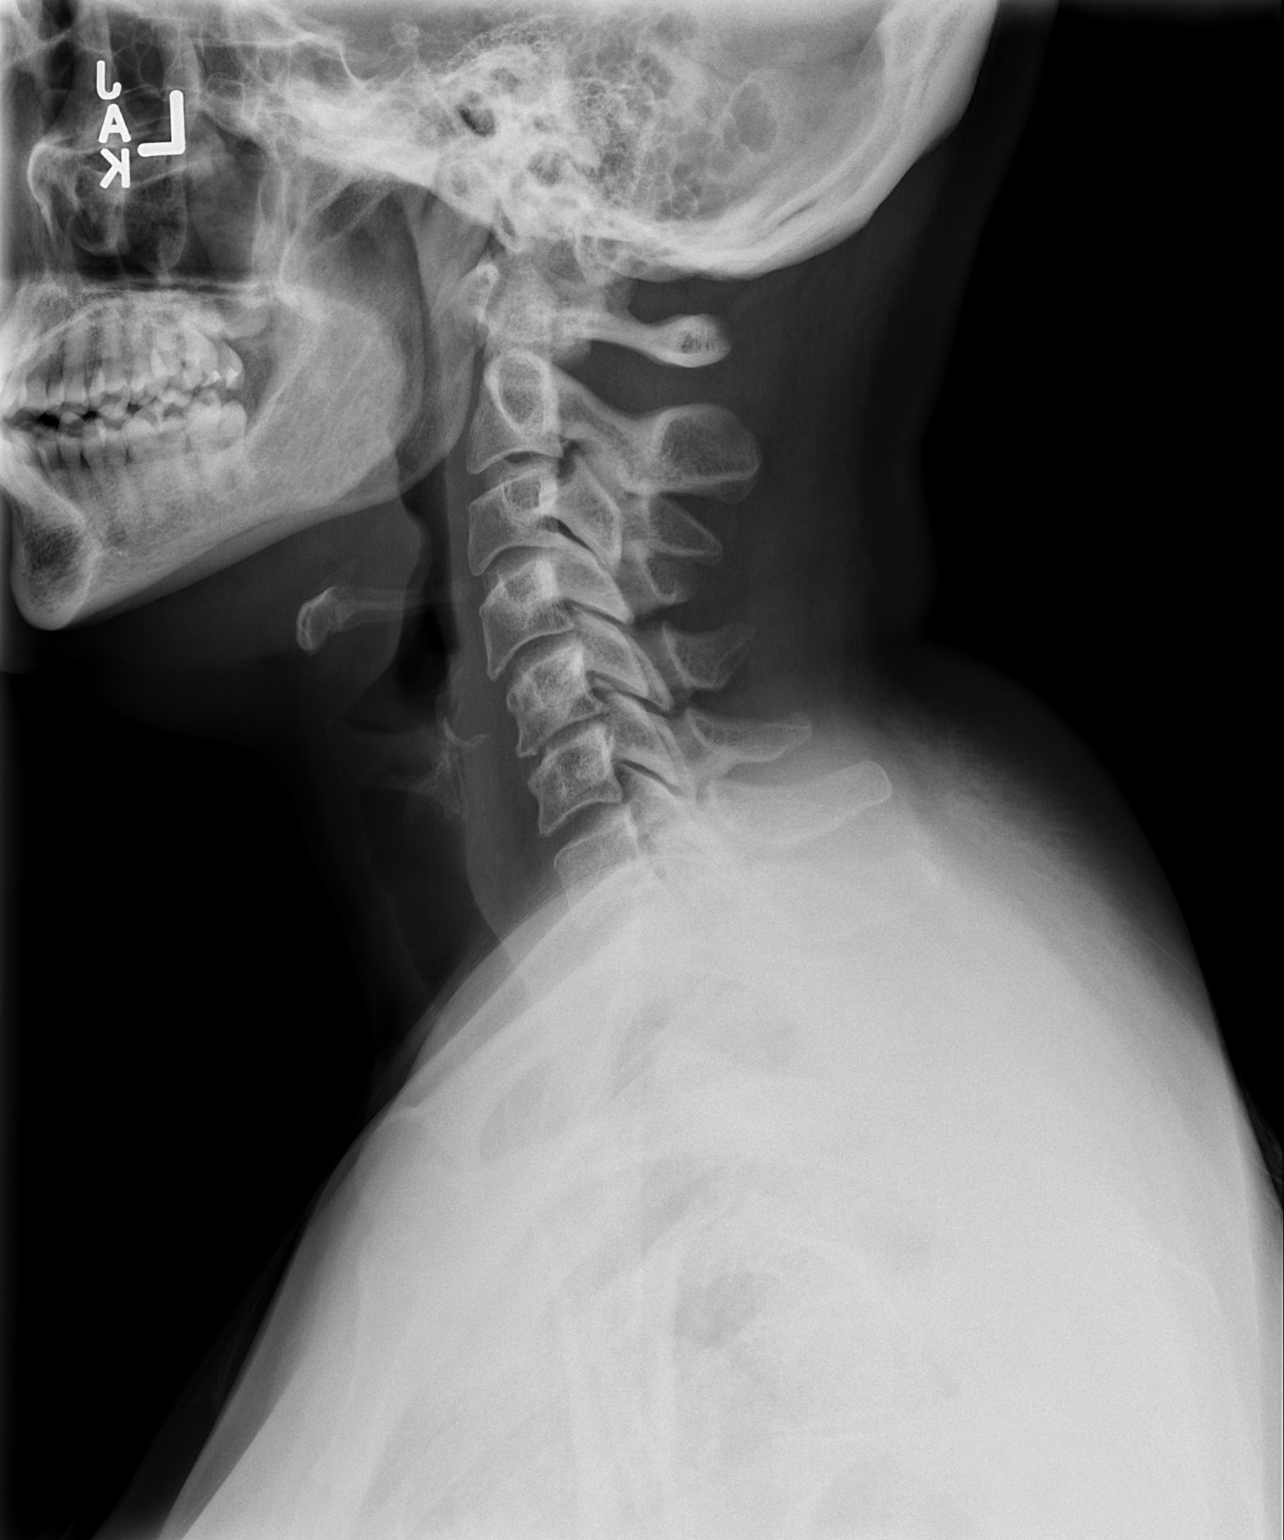

[w c-spine a.p. *]
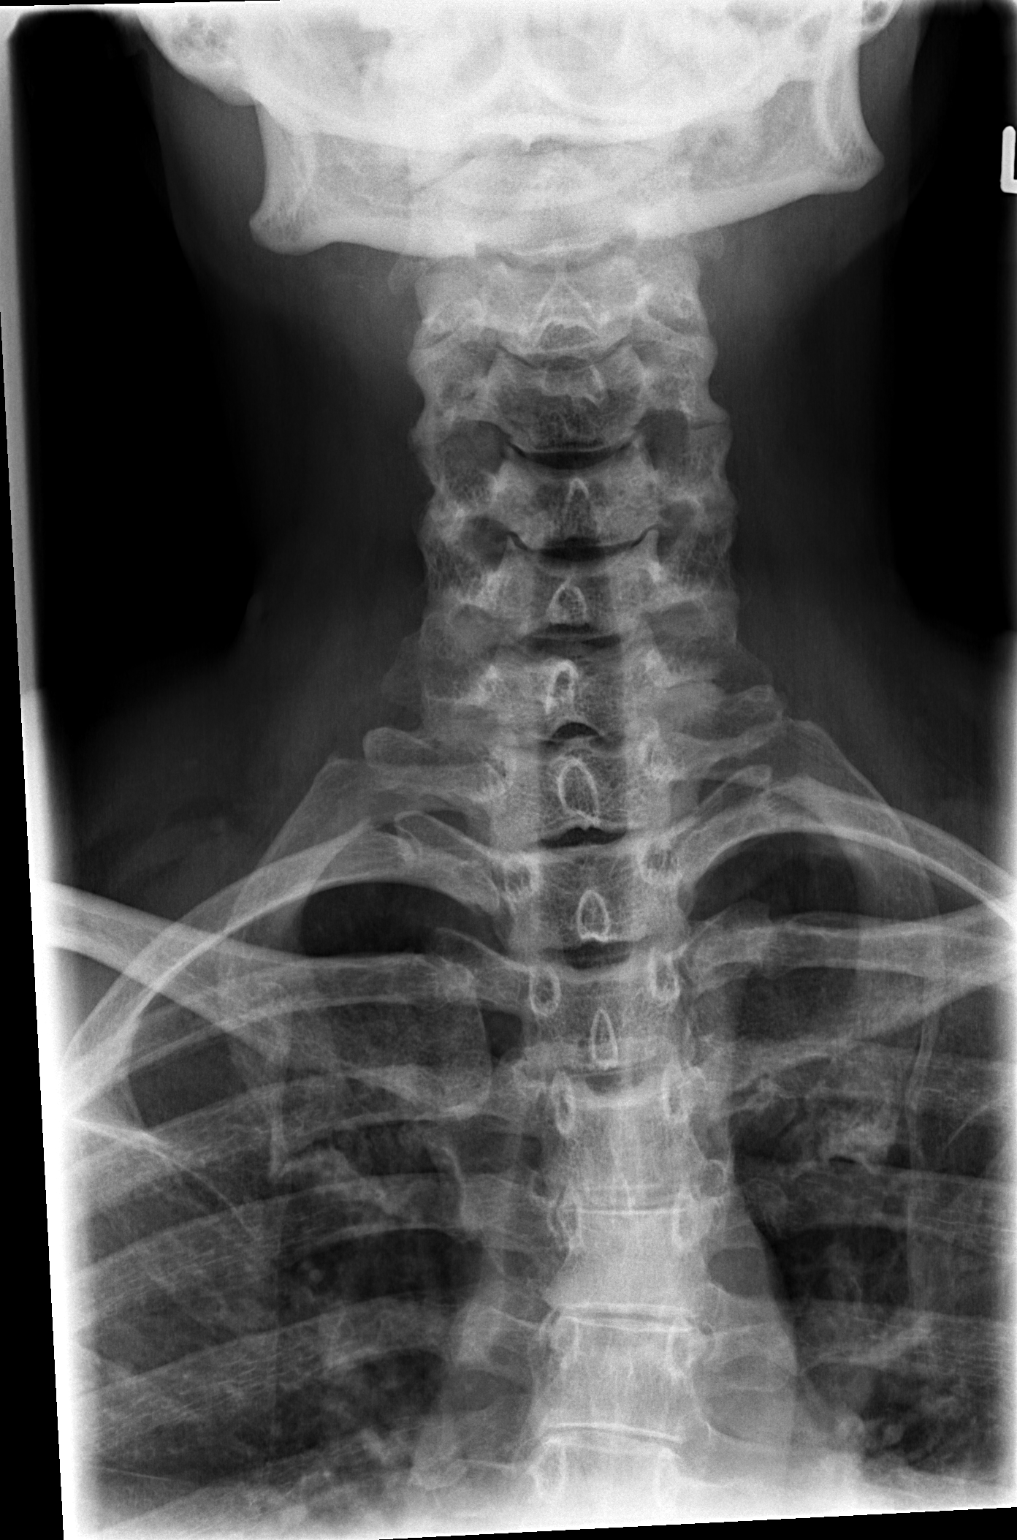

[w c-spine odontoid *]
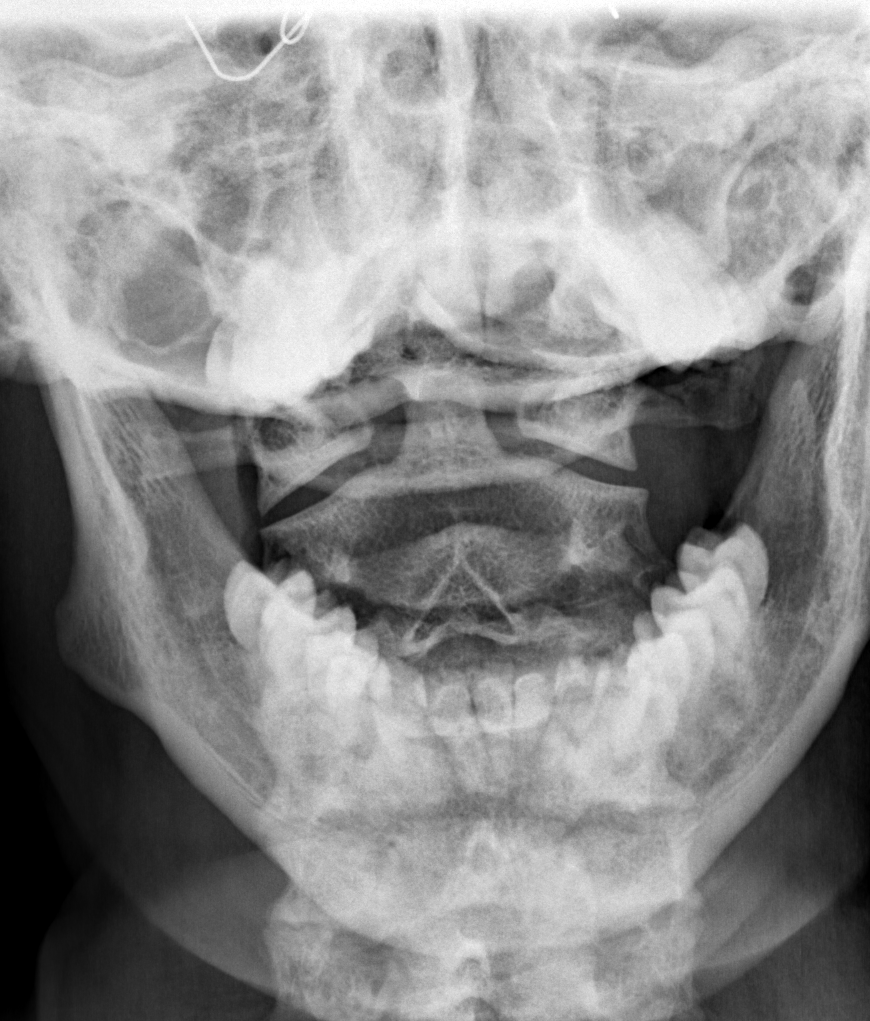

[3 of 3 positions shown; findings below may reference images not displayed]

FINDINGS: Seven cervical segments are well visualized. Vertebral body height
is well maintained. Mild disc space narrowing is noted at C5-6. No
prevertebral soft tissue abnormality is noted. No acute fracture is
seen. The odontoid is within normal limits.
IMPRESSION: Mild degenerative change as described.

## 2019-07-20 DIAGNOSIS — Z03818 Encounter for observation for suspected exposure to other biological agents ruled out: Secondary | ICD-10-CM | POA: Diagnosis not present

## 2019-07-20 DIAGNOSIS — Z20828 Contact with and (suspected) exposure to other viral communicable diseases: Secondary | ICD-10-CM | POA: Diagnosis not present

## 2019-07-23 DIAGNOSIS — F4323 Adjustment disorder with mixed anxiety and depressed mood: Secondary | ICD-10-CM | POA: Diagnosis not present

## 2019-07-28 DIAGNOSIS — Z713 Dietary counseling and surveillance: Secondary | ICD-10-CM | POA: Diagnosis not present

## 2019-08-02 DIAGNOSIS — Z20828 Contact with and (suspected) exposure to other viral communicable diseases: Secondary | ICD-10-CM | POA: Diagnosis not present

## 2019-08-02 DIAGNOSIS — Z03818 Encounter for observation for suspected exposure to other biological agents ruled out: Secondary | ICD-10-CM | POA: Diagnosis not present

## 2019-08-06 DIAGNOSIS — D259 Leiomyoma of uterus, unspecified: Secondary | ICD-10-CM | POA: Diagnosis not present

## 2019-08-06 DIAGNOSIS — N920 Excessive and frequent menstruation with regular cycle: Secondary | ICD-10-CM | POA: Diagnosis not present

## 2019-08-11 DIAGNOSIS — N920 Excessive and frequent menstruation with regular cycle: Secondary | ICD-10-CM | POA: Diagnosis not present

## 2019-08-11 DIAGNOSIS — F4323 Adjustment disorder with mixed anxiety and depressed mood: Secondary | ICD-10-CM | POA: Diagnosis not present

## 2019-08-11 DIAGNOSIS — D259 Leiomyoma of uterus, unspecified: Secondary | ICD-10-CM | POA: Diagnosis not present

## 2019-08-11 DIAGNOSIS — N946 Dysmenorrhea, unspecified: Secondary | ICD-10-CM | POA: Diagnosis not present

## 2019-08-18 DIAGNOSIS — E7800 Pure hypercholesterolemia, unspecified: Secondary | ICD-10-CM | POA: Diagnosis not present

## 2019-08-18 DIAGNOSIS — E1169 Type 2 diabetes mellitus with other specified complication: Secondary | ICD-10-CM | POA: Diagnosis not present

## 2019-08-18 DIAGNOSIS — Z713 Dietary counseling and surveillance: Secondary | ICD-10-CM | POA: Diagnosis not present

## 2019-08-18 DIAGNOSIS — I10 Essential (primary) hypertension: Secondary | ICD-10-CM | POA: Diagnosis not present

## 2019-08-25 DIAGNOSIS — F4323 Adjustment disorder with mixed anxiety and depressed mood: Secondary | ICD-10-CM | POA: Diagnosis not present

## 2019-09-08 NOTE — H&P (Signed)
NAME: Ashley Snyder, Ashley Snyder MEDICAL RECORD [id number] ACCOUNT [id number] DATE OF BIRTH:May 13, 1970 FACILITY: [facility] LOCATION:  PHYSICIAN:Dallas Torok Garry Heater, MD  HISTORY AND PHYSICAL  DATE OF ADMISSION:  09/28/2019  CHIEF COMPLAINT:  Symptomatic leiomyoma.  HISTORY OF PRESENT ILLNESS:  A 49 year old married G1 P1 on continuous OCPs, prior Essure tubal, known to have fibroids, had been on her continuous OCPs to regulate her bleeding, which had worked reasonably well until recently when she began to  experience continued problematic bleeding.  Followup saline ultrasound performed 06/21 showed fibroids as noted 7.2 x 6.0, 4.2, 3.1, 2.9.  There was also a subserosal fibroid 4.6 cm, adnexa negative, the endometrial lining was 3.5 mm.  She presents at this time for definitive abdominal  hysterectomy with bilateral salpingectomy.  This procedure discussed at length, including the specific risks regarding bleeding, infection, transfusion, wound infection, phlebitis, adjacent organ injury, rationale for bilateral salpingectomy was  discussed.  Her expected recovery time was reviewed also.  ALLERGIES:  None.  MEDICATIONS:  Cozaar 25 mg daily, diclofenac 75 mg 1 b.i.d. as needed, Junel Fe 1/20 OCP continuous, metformin 500 mg once daily, metoprolol, rosuvastatin 5 mg once daily.  FAMILY HISTORY:  Significant for mother with hypertension.  SOCIAL HISTORY:  She is a never smoker, occasional alcohol use.  She is married and works for the [employer].  PAST SURGICAL HISTORY:  She has had a rotator cuff repair and Essure tubal in 2009.  PHYSICAL EXAMINATION: VITAL SIGNS:  Temperature 98.2, blood pressure 120/78. HEENT:  Unremarkable. NECK:  Supple, without masses. LUNGS:  Clear. CARDIOVASCULAR:  Regular rate and rhythm without murmurs, rubs or gallops. BREASTS:  Without masses. ABDOMEN:  Soft, flat, nontender. PELVIC:  Vulva, vagina, cervix normal.  Her last Pap in 2017 was normal.  Uterus  was enlarged 12-week size.  Adnexa negative.  IMPRESSION:  Symptomatic leiomyoma.  The size of the fibroids have increased from her prior ultrasound in 2014, not controlling well with continuous oral contraception pills.  PLAN:  TAH, bilateral salpingectomy.  Procedure and risks discussed as above.  VN/NUANCE  D:09/08/2019 T:09/08/2019 JOB:[id number]

## 2019-09-09 DIAGNOSIS — F4323 Adjustment disorder with mixed anxiety and depressed mood: Secondary | ICD-10-CM | POA: Diagnosis not present

## 2019-09-12 ENCOUNTER — Ambulatory Visit: Payer: Federal, State, Local not specified - PPO | Attending: Internal Medicine

## 2019-09-12 DIAGNOSIS — Z23 Encounter for immunization: Secondary | ICD-10-CM

## 2019-09-12 NOTE — Progress Notes (Signed)
   Covid-19 Vaccination Clinic  Name:  Ashley Snyder    MRN: [id number] DOB: 11-Feb-1971  09/12/2019  [name] was observed post Covid-19 immunization for 15 minutes without incident. She was provided with Vaccine Information Sheet and instruction to access the V-Safe system.   [name] was instructed to call 911 with any severe reactions post vaccine: Marland Kitchen Difficulty breathing  . Swelling of face and throat  . A fast heartbeat  . A bad rash all over body  . Dizziness and weakness   Immunizations Administered    Name Date Dose VIS Date Route   Pfizer COVID-19 Vaccine 09/12/2019 12:02 PM 0.3 mL 04/29/2018 Intramuscular   Manufacturer: [place]   Lot: [id number]   [place]: [address]-8

## 2019-09-16 ENCOUNTER — Encounter (HOSPITAL_COMMUNITY): Payer: Self-pay

## 2019-09-16 NOTE — Progress Notes (Addendum)
COVID Vaccine Completed: No Date COVID Vaccine completed: Has  received first dose COVID vaccine manufacturer: Pfizer      PCP - Dr. Alfonso Patten. Polite Cardiologist - N/a  Chest x-ray - greater than 1 year EKG - 09/17/2019 in epic (pre op appt) Stress Test - greater than 2 year ECHO - N/A Cardiac Cath - N/A  Sleep Study - N/A CPAP - N/A  Fasting Blood Sugar - 130's-155 Checks Blood Sugar __2___ times a week  Blood Thinner Instructions:N/A Aspirin Instructions:N/A Last Dose:N/A  Anesthesia review: N/A  Patient denies shortness of breath, fever, cough and chest pain at PAT appointment   Patient verbalized understanding of instructions that were given to them at the PAT appointment. Patient was also instructed that they will need to review over the PAT instructions again at home before surgery.

## 2019-09-16 NOTE — Patient Instructions (Addendum)
DUE TO COVID-19 ONLY ONE VISITOR IS ALLOWED IN WAITING ROOM (VISITOR WILL HAVE A TEMPERATURE CHECK ON ARRIVAL AND MUST WEAR A FACE MASK THE ENTIRE TIME.)  ONCE YOU ARE ADMITTED TO YOUR PRIVATE ROOM, THE SAME ONE VISITOR IS ALLOWED TO VISIT DURING VISITING HOURS ONLY.  Your COVID swab testing is scheduled for Thursday, September 24, 2019 at 3:10 PM , You must self quarantine after your testing per handout given to you at the testing site.  ([facility] up testing enter pre-surgical testing line)   Your procedure is scheduled on: Monday, September 28, 2019  Report to [facility] AT  5:30 A. M.   Call this number if you have problems the morning of surgery:  [phone].   OUR ADDRESS IS [address].  WE ARE LOCATED IN THE [place]                                   [facility].                                     REMEMBER:  DO NOT EAT FOOD AFTER MIDNIGHT .    MAY HAVE LIQUIDS UNTIL 4:30 AM DAY OF SURGERY  CLEAR LIQUID DIET  Foods Allowed                                                                     Foods Excluded  Water, Black Coffee and tea, regular and decaf                             liquids that you cannot  Plain Jell-O in any flavor  (No red)                                           see through such as: Fruit ices (not with fruit pulp)                                     milk, soups, orange juice  Iced Popsicles (No red)                                    All solid food                                   Apple juices Sports drinks like Gatorade (No red) Lightly seasoned clear broth or consume(fat free) Sugar, honey syrup  Sample Menu Breakfast                                Lunch  Supper Cranberry juice                    Beef broth                            Chicken broth Jell-O                                     Grape juice                           Apple  juice Coffee or tea                        Jell-O                                      Popsicle                                                Coffee or tea                        Coffee or tea  BRUSH YOUR TEETH THE MORNING OF SURGERY.  TAKE THESE MEDICATIONS MORNING OF SURGERY WITH A SIP OF WATER:  METOPROLOL, ROSUVASTATIN  DO NOT WEAR JEWERLY, MAKE UP, OR NAIL POLISH.  DO NOT WEAR LOTIONS, POWDERS, PERFUMES/COLOGNE OR DEODORANT.  DO NOT SHAVE FOR 24 HOURS PRIOR TO DAY OF SURGERY.  CONTACTS, GLASSES, OR DENTURES MAY NOT BE WORN TO SURGERY.                                    [facility] IS NOT RESPONSIBLE  FOR ANY BELONGINGS.          BRING ALL PRESCRIPTION MEDICATIONS WITH YOU THE DAY OF SURGERY IN ORIGINAL CONTAINERS     How to Manage Your Diabetes Before and After Surgery  Why is it important to control my blood sugar before and after surgery? . Improving blood sugar levels before and after surgery helps healing and can limit problems. . A way of improving blood sugar control is eating a healthy diet by: o  Eating less sugar and carbohydrates o  Increasing activity/exercise o  Talking with your doctor about reaching your blood sugar goals . High blood sugars (greater than 180 mg/dL) can raise your risk of infections and slow your recovery, so you will need to focus on controlling your diabetes during the weeks before surgery. . Make sure that the doctor who takes care of your diabetes knows about your planned surgery including the date and location.  How do I manage my blood sugar before surgery? . Check your blood sugar at least 4 times a day, starting 2 days before surgery, to make sure that the level is not too high or low. o Check your blood sugar the morning of your surgery when you wake up and every 2 hours until you get to the Short Stay unit. . If your blood sugar is less than 70  mg/dL, you will need to treat for low blood sugar: o Do not take insulin. o Treat a low  blood sugar (less than 70 mg/dL) with  cup of clear juice (cranberry or apple), 4 glucose tablets, OR glucose gel. o Recheck blood sugar in 15 minutes after treatment (to make sure it is greater than 70 mg/dL). If your blood sugar is not greater than 70 mg/dL on recheck, call [phone] for further instructions. . Report your blood sugar to the short stay nurse when you get to Short Stay.  . If you are admitted to the hospital after surgery: o Your blood sugar will be checked by the staff and you will probably be given insulin after surgery (instead of oral diabetes medicines) to make sure you have good blood sugar levels. o The goal for blood sugar control after surgery is 80-180 mg/dL.   WHAT DO I DO ABOUT MY DIABETES MEDICATION?  Marland Kitchen Do not take oral diabetes medicines (pills) the morning of surgery.  Reviewed and Endorsed by [facility] Patient Education Committee, August 2015                                                            [facility] - Preparing for Surgery Before surgery, you can play an important role.  Because skin is not sterile, your skin needs to be as free of germs as possible.  You can reduce the number of germs on your skin by washing with CHG (chlorahexidine gluconate) soap before surgery.  CHG is an antiseptic cleaner which kills germs and bonds with the skin to continue killing germs even after washing. Please DO NOT use if you have an allergy to CHG or antibacterial soaps.  If your skin becomes reddened/irritated stop using the CHG and inform your nurse when you arrive at Short Stay. Do not shave (including legs and underarms) for at least 48 hours prior to the first CHG shower.  You may shave your face/neck.  Please follow these instructions carefully:  1.  Shower with CHG Soap the night before surgery and the  morning of surgery.  2.  If you choose to wash your hair, wash your hair first as usual with your normal  shampoo.  3.  After you shampoo, rinse your  hair and body thoroughly to remove the shampoo.                             4.  Use CHG as you would any other liquid soap.  You can apply chg directly to the skin and wash.  Gently with a scrungie or clean washcloth.  5.  Apply the CHG Soap to your body ONLY FROM THE NECK DOWN.   Do   not use on face/ open                           Wound or open sores. Avoid contact with eyes, ears mouth and   genitals (private parts).                       Wash face,  Genitals (private parts) with your normal soap.  6.  Wash thoroughly, paying special attention to the area where your    surgery  will be performed.  7.  Thoroughly rinse your body with warm water from the neck down.  8.  DO NOT shower/wash with your normal soap after using and rinsing off the CHG Soap.                9.  Pat yourself dry with a clean towel.            10.  Wear clean pajamas.            11.  Place clean sheets on your bed the night of your first shower and do not  sleep with pets. Day of Surgery : Do not apply any lotions/deodorants the morning of surgery.  Please wear clean clothes to the hospital/surgery center.  FAILURE TO FOLLOW THESE INSTRUCTIONS MAY RESULT IN THE CANCELLATION OF YOUR SURGERY  PATIENT SIGNATURE_________________________________  NURSE SIGNATURE__________________________________  ________________________________________________________________________  WHAT IS A BLOOD TRANSFUSION? Blood Transfusion Information  A transfusion is the replacement of blood or some of its parts. Blood is made up of multiple cells which provide different functions.  Red blood cells carry oxygen and are used for blood loss replacement.  White blood cells fight against infection.  Platelets control bleeding.  Plasma helps clot blood.  Other blood products are available for specialized needs, such as hemophilia or other clotting disorders. BEFORE THE TRANSFUSION  Who gives blood for transfusions?   Healthy  volunteers who are fully evaluated to make sure their blood is safe. This is blood bank blood. Transfusion therapy is the safest it has ever been in the practice of medicine. Before blood is taken from a donor, a complete history is taken to make sure that person has no history of diseases nor engages in risky social behavior (examples are intravenous drug use or sexual activity with multiple partners). The donor's travel history is screened to minimize risk of transmitting infections, such as malaria. The donated blood is tested for signs of infectious diseases, such as HIV and hepatitis. The blood is then tested to be sure it is compatible with you in order to minimize the chance of a transfusion reaction. If you or a relative donates blood, this is often done in anticipation of surgery and is not appropriate for emergency situations. It takes many days to process the donated blood. RISKS AND COMPLICATIONS Although transfusion therapy is very safe and saves many lives, the main dangers of transfusion include:   Getting an infectious disease.  Developing a transfusion reaction. This is an allergic reaction to something in the blood you were given. Every precaution is taken to prevent this. The decision to have a blood transfusion has been considered carefully by your caregiver before blood is given. Blood is not given unless the benefits outweigh the risks. AFTER THE TRANSFUSION  Right after receiving a blood transfusion, you will usually feel much better and more energetic. This is especially true if your red blood cells have gotten low (anemic). The transfusion raises the level of the red blood cells which carry oxygen, and this usually causes an energy increase.  The nurse administering the transfusion will monitor you carefully for complications. HOME CARE INSTRUCTIONS  No special instructions are needed after a transfusion. You may find your energy is better. Speak with your caregiver about any  limitations on activity for underlying diseases you may have. SEEK MEDICAL CARE IF:   Your condition is not  improving after your transfusion.  You develop redness or irritation at the intravenous (IV) site. SEEK IMMEDIATE MEDICAL CARE IF:  Any of the following symptoms occur over the next 12 hours:  Shaking chills.  You have a temperature by mouth above 102 F (38.9 C), not controlled by medicine.  Chest, back, or muscle pain.  People around you feel you are not acting correctly or are confused.  Shortness of breath or difficulty breathing.  Dizziness and fainting.  You get a rash or develop hives.  You have a decrease in urine output.  Your urine turns a dark color or changes to pink, red, or brown. Any of the following symptoms occur over the next 10 days:  You have a temperature by mouth above 102 F (38.9 C), not controlled by medicine.  Shortness of breath.  Weakness after normal activity.  The white part of the eye turns yellow (jaundice).  You have a decrease in the amount of urine or are urinating less often.  Your urine turns a dark color or changes to pink, red, or brown. Document Released: 02/17/2000 Document Revised: 05/14/2011 Document Reviewed: 10/06/2007 [facility] Patient Information 2014 [place], [place].  _______________________________________________________________________

## 2019-09-17 ENCOUNTER — Other Ambulatory Visit: Payer: Self-pay

## 2019-09-17 ENCOUNTER — Encounter (HOSPITAL_COMMUNITY): Payer: Self-pay

## 2019-09-17 ENCOUNTER — Encounter (HOSPITAL_COMMUNITY)
Admission: RE | Admit: 2019-09-17 | Discharge: 2019-09-17 | Disposition: A | Discharge status: Home / Self Care | Payer: Federal, State, Local not specified - PPO | Source: Ambulatory Visit | Attending: Obstetrics and Gynecology | Admitting: Obstetrics and Gynecology

## 2019-09-17 DIAGNOSIS — Z01818 Encounter for other preprocedural examination: Secondary | ICD-10-CM | POA: Diagnosis not present

## 2019-09-17 HISTORY — DX: Polyneuropathy, unspecified: G62.9

## 2019-09-17 HISTORY — DX: Gastro-esophageal reflux disease without esophagitis: K21.9

## 2019-09-17 HISTORY — DX: Disorder of kidney and ureter, unspecified: N28.9

## 2019-09-17 HISTORY — DX: Tachycardia, unspecified: R00.0

## 2019-09-17 HISTORY — DX: Migraine, unspecified, not intractable, without status migrainosus: G43.909

## 2019-09-17 HISTORY — DX: Other intervertebral disc displacement, lumbar region: M51.26

## 2019-09-17 HISTORY — DX: Irritable bowel syndrome, unspecified: K58.9

## 2019-09-17 LAB — CBC
HCT: 38 % (ref 36.0–46.0)
Hemoglobin: 12.2 g/dL (ref 12.0–15.0)
MCH: 27.9 pg (ref 26.0–34.0)
MCHC: 32.1 g/dL (ref 30.0–36.0)
MCV: 87 fL (ref 80.0–100.0)
Platelets: 281 10*3/uL (ref 150–400)
RBC: 4.37 MIL/uL (ref 3.87–5.11)
RDW: 13.2 % (ref 11.5–15.5)
WBC: 7.4 10*3/uL (ref 4.0–10.5)
nRBC: 0 % (ref 0.0–0.2)

## 2019-09-17 LAB — BASIC METABOLIC PANEL
Anion gap: 10 (ref 5–15)
BUN: 15 mg/dL (ref 6–20)
CO2: 24 mmol/L (ref 22–32)
Calcium: 9.1 mg/dL (ref 8.9–10.3)
Chloride: 104 mmol/L (ref 98–111)
Creatinine, Ser: 0.7 mg/dL (ref 0.44–1.00)
GFR calc Af Amer: >60 mL/min (ref >60)
GFR calc non Af Amer: >60 mL/min (ref >60)
Glucose, Bld: 86 mg/dL (ref 70–99)
Potassium: 4.1 mmol/L (ref 3.5–5.1)
Sodium: 138 mmol/L (ref 135–145)

## 2019-09-17 LAB — HEMOGLOBIN A1C
Hgb A1c MFr Bld: 6.9 % — ABNORMAL HIGH (ref 4.8–5.6)
Mean Plasma Glucose: 151.33 mg/dL

## 2019-09-18 LAB — TYPE AND SCREEN
ABO/RH(D): O POS
Antibody Screen: NEGATIVE

## 2019-09-18 LAB — GLUCOSE, CAPILLARY: Glucose-Capillary: 88 mg/dL (ref 70–99)

## 2019-09-23 DIAGNOSIS — F4323 Adjustment disorder with mixed anxiety and depressed mood: Secondary | ICD-10-CM | POA: Diagnosis not present

## 2019-09-24 ENCOUNTER — Inpatient Hospital Stay (HOSPITAL_COMMUNITY)
Admission: RE | Admit: 2019-09-24 | Discharge: 2019-09-24 | Disposition: A | Discharge status: Home / Self Care | Payer: Federal, State, Local not specified - PPO | Source: Ambulatory Visit

## 2019-09-24 DIAGNOSIS — D259 Leiomyoma of uterus, unspecified: Secondary | ICD-10-CM | POA: Diagnosis not present

## 2019-09-24 NOTE — Progress Notes (Addendum)
Patient was positive for COVID on 07/20/19.  No pre-procedure test needed before surgery.  Copy of results are in care everywehre      Ref Range & Units 2 mo ago Comments  [facility] SARS-COV-2 RNA, QL NAAT, RT PCR/TMA (COVID-19) Negative  Positive Abnormal   Positive: SARS-CoV-2 detected  Testing identified the presence of SARS-CoV-2 (the virus that causes COVID-19) in the patient's sample.   This test was developed for the detection of nucleic acids from the SARS-CoV-2 virus by RT-PCR in individuals who meet SARS-CoV-2 clinical and/or epidemiological criteria.  This test has not been FDA cleared or approved.  This test has been authorized by FDA under an EUA for use by the authorized laboratory. This test is only authorized for the duration of time that the Secretary of the HHS declares circumstances exist justifying the authorization of the emergency use of  in vitro diagnostic tests for detection of SARS-CoV-2 virus and/or diagnosis of COVID-19 infection under section 564(b)(1) of the Act, 21 U.S.C. 356POL-4(D)(0), unless the authorization is terminated or revoked sooner. Specimens that are self-collected  were not tested with an internal control to confirm that the specimen was properly collected. As such, unsupervised self-collected specimens from SARS-CoV-2 positive individuals may yield negative results if the specimen was not collected properly.  To learn more about this test, go to https://www.[facility].com/pages/covid19-efforts  Performed byLincoln Maxin, CAP [id number], CLIA [id number], [phone] [place] [address]  Laboratory Director: Evette Cristal, PhD, [facility] [place], [facility]      [place]   Specimen Collected: 07/20/19 10:33 AM Last Resulted: 07/21/19  9:00 PM  Received From: [facility]  Result Received: 09/04/19 11:02 AM  Encounter Summary

## 2019-09-27 NOTE — Anesthesia Preprocedure Evaluation (Addendum)
Anesthesia Evaluation  Patient identified by MRN, date of birth, ID band Patient awake    Reviewed: Allergy & Precautions, NPO status , Patient's Chart, lab work & pertinent test results, reviewed documented beta blocker date and time   Airway Mallampati: II  TM Distance: >3 FB Neck ROM: Full    Dental  (+) Dental Advisory Given   Pulmonary neg pulmonary ROS,    breath sounds clear to auscultation       Cardiovascular hypertension, Pt. on medications and Pt. on home beta blockers  Rhythm:Regular Rate:Normal     Neuro/Psych  Headaches,    GI/Hepatic Neg liver ROS, GERD  ,  Endo/Other  diabetes, Type 2, Oral Hypoglycemic Agents  Renal/GU negative Renal ROS     Musculoskeletal   Abdominal   Peds  Hematology negative hematology ROS (+)   Anesthesia Other Findings   Reproductive/Obstetrics                            Lab Results  Component Value Date   WBC 7.4 09/17/2019   HGB 12.2 09/17/2019   HCT 38.0 09/17/2019   MCV 87.0 09/17/2019   PLT 281 09/17/2019   Lab Results  Component Value Date   CREATININE 0.70 09/17/2019   BUN 15 09/17/2019   NA 138 09/17/2019   K 4.1 09/17/2019   CL 104 09/17/2019   CO2 24 09/17/2019    Anesthesia Physical Anesthesia Plan  ASA: II  Anesthesia Plan: General   Post-op Pain Management:    Induction: Intravenous  PONV Risk Score and Plan: 4 or greater and Midazolam, Dexamethasone, Ondansetron and Scopolamine patch - Pre-op  Airway Management Planned: Oral ETT  Additional Equipment: None  Intra-op Plan:   Post-operative Plan: Extubation in OR  Informed Consent: I have reviewed the patients History and Physical, chart, labs and discussed the procedure including the risks, benefits and alternatives for the proposed anesthesia with the patient or authorized representative who has indicated his/her understanding and acceptance.     Dental  advisory given  Plan Discussed with: CRNA  Anesthesia Plan Comments:        Anesthesia Quick Evaluation

## 2019-09-28 ENCOUNTER — Encounter (HOSPITAL_BASED_OUTPATIENT_CLINIC_OR_DEPARTMENT_OTHER): Payer: Self-pay | Admitting: Obstetrics and Gynecology

## 2019-09-28 ENCOUNTER — Ambulatory Visit (HOSPITAL_BASED_OUTPATIENT_CLINIC_OR_DEPARTMENT_OTHER): Payer: Federal, State, Local not specified - PPO | Admitting: Anesthesiology

## 2019-09-28 ENCOUNTER — Encounter (HOSPITAL_BASED_OUTPATIENT_CLINIC_OR_DEPARTMENT_OTHER)
Admission: RE | Disposition: A | Discharge status: Home / Self Care | Payer: Self-pay | Source: Other Acute Inpatient Hospital | Attending: Obstetrics and Gynecology

## 2019-09-28 ENCOUNTER — Observation Stay (HOSPITAL_BASED_OUTPATIENT_CLINIC_OR_DEPARTMENT_OTHER)
Admission: RE | Admit: 2019-09-28 | Discharge: 2019-09-29 | Disposition: A | Discharge status: Home / Self Care | Payer: Federal, State, Local not specified - PPO | Source: Other Acute Inpatient Hospital | Attending: Obstetrics and Gynecology | Admitting: Obstetrics and Gynecology

## 2019-09-28 DIAGNOSIS — D252 Subserosal leiomyoma of uterus: Secondary | ICD-10-CM | POA: Diagnosis not present

## 2019-09-28 DIAGNOSIS — D251 Intramural leiomyoma of uterus: Secondary | ICD-10-CM | POA: Diagnosis not present

## 2019-09-28 DIAGNOSIS — Z79899 Other long term (current) drug therapy: Secondary | ICD-10-CM | POA: Diagnosis not present

## 2019-09-28 DIAGNOSIS — D219 Benign neoplasm of connective and other soft tissue, unspecified: Secondary | ICD-10-CM | POA: Diagnosis present

## 2019-09-28 DIAGNOSIS — D250 Submucous leiomyoma of uterus: Secondary | ICD-10-CM | POA: Diagnosis not present

## 2019-09-28 DIAGNOSIS — Z7984 Long term (current) use of oral hypoglycemic drugs: Secondary | ICD-10-CM | POA: Diagnosis not present

## 2019-09-28 DIAGNOSIS — D259 Leiomyoma of uterus, unspecified: Principal | ICD-10-CM | POA: Insufficient documentation

## 2019-09-28 DIAGNOSIS — I10 Essential (primary) hypertension: Secondary | ICD-10-CM | POA: Diagnosis not present

## 2019-09-28 DIAGNOSIS — E119 Type 2 diabetes mellitus without complications: Secondary | ICD-10-CM | POA: Diagnosis not present

## 2019-09-28 HISTORY — PX: ABDOMINAL HYSTERECTOMY: SHX81

## 2019-09-28 LAB — POCT PREGNANCY, URINE: Preg Test, Ur: NEGATIVE

## 2019-09-28 LAB — GLUCOSE, CAPILLARY
Glucose-Capillary: 105 mg/dL — ABNORMAL HIGH (ref 70–99)
Glucose-Capillary: 150 mg/dL — ABNORMAL HIGH (ref 70–99)

## 2019-09-28 LAB — ABO/RH: ABO/RH(D): O POS

## 2019-09-28 SURGERY — HYSTERECTOMY, ABDOMINAL
Anesthesia: General | Site: Abdomen | Laterality: Bilateral

## 2019-09-28 MED ORDER — BUPIVACAINE HCL 0.25 % IJ SOLN
INTRAMUSCULAR | Status: DC | PRN
  Administered 2019-09-28: 30 mL

## 2019-09-28 MED ORDER — CELECOXIB 200 MG PO CAPS
200.0000 mg | ORAL_CAPSULE | Freq: Once | ORAL | Status: AC
  Administered 2019-09-28: 200 mg via ORAL

## 2019-09-28 MED ORDER — FENTANYL CITRATE (PF) 100 MCG/2ML IJ SOLN
INTRAMUSCULAR | Status: AC
  Filled 2019-09-28: qty 2

## 2019-09-28 MED ORDER — NALOXONE HCL 0.4 MG/ML IJ SOLN: 0.4000 mg | INTRAMUSCULAR | Status: DC | PRN

## 2019-09-28 MED ORDER — MIDAZOLAM HCL 2 MG/2ML IJ SOLN
INTRAMUSCULAR | Status: AC
  Filled 2019-09-28: qty 2

## 2019-09-28 MED ORDER — SUGAMMADEX SODIUM 200 MG/2ML IV SOLN
INTRAVENOUS | Status: DC | PRN
  Administered 2019-09-28: 200 mg via INTRAVENOUS

## 2019-09-28 MED ORDER — ONDANSETRON HCL 4 MG/2ML IJ SOLN: 4.0000 mg | Freq: Four times a day (QID) | INTRAMUSCULAR | Status: DC | PRN

## 2019-09-28 MED ORDER — LIDOCAINE 2% (20 MG/ML) 5 ML SYRINGE
INTRAMUSCULAR | Status: AC
  Filled 2019-09-28: qty 5

## 2019-09-28 MED ORDER — MENTHOL 3 MG MT LOZG: 1.0000 | LOZENGE | OROMUCOSAL | Status: DC | PRN

## 2019-09-28 MED ORDER — POVIDONE-IODINE 10 % EX SWAB: 2.0000 "application " | Freq: Once | CUTANEOUS | Status: DC

## 2019-09-28 MED ORDER — ONDANSETRON HCL 4 MG/2ML IJ SOLN
INTRAMUSCULAR | Status: AC
  Filled 2019-09-28: qty 2

## 2019-09-28 MED ORDER — CELECOXIB 200 MG PO CAPS
ORAL_CAPSULE | ORAL | Status: AC
  Filled 2019-09-28: qty 1

## 2019-09-28 MED ORDER — MIDAZOLAM HCL 2 MG/2ML IJ SOLN
INTRAMUSCULAR | Status: DC | PRN
  Administered 2019-09-28: 2 mg via INTRAVENOUS

## 2019-09-28 MED ORDER — METOPROLOL SUCCINATE ER 50 MG PO TB24
50.0000 mg | ORAL_TABLET | Freq: Every day | ORAL | Status: DC
  Filled 2019-09-28: qty 1

## 2019-09-28 MED ORDER — FENTANYL CITRATE (PF) 100 MCG/2ML IJ SOLN
INTRAMUSCULAR | Status: DC | PRN
  Administered 2019-09-28: 50 ug via INTRAVENOUS
  Administered 2019-09-28: 100 ug via INTRAVENOUS
  Administered 2019-09-28 (×2): 50 ug via INTRAVENOUS

## 2019-09-28 MED ORDER — LACTATED RINGERS IV SOLN: INTRAVENOUS | Status: DC

## 2019-09-28 MED ORDER — GABAPENTIN 100 MG PO CAPS
ORAL_CAPSULE | ORAL | Status: AC
  Filled 2019-09-28: qty 2

## 2019-09-28 MED ORDER — AMISULPRIDE (ANTIEMETIC) 5 MG/2ML IV SOLN
INTRAVENOUS | Status: AC
  Filled 2019-09-28: qty 4

## 2019-09-28 MED ORDER — DIPHENHYDRAMINE HCL 50 MG/ML IJ SOLN: 12.5000 mg | Freq: Four times a day (QID) | INTRAMUSCULAR | Status: DC | PRN

## 2019-09-28 MED ORDER — HYDROCODONE-ACETAMINOPHEN 5-325 MG PO TABS
1.0000 | ORAL_TABLET | ORAL | Status: DC | PRN
  Administered 2019-09-28 – 2019-09-29 (×3): 1 via ORAL
  Administered 2019-09-29: 2 via ORAL

## 2019-09-28 MED ORDER — DIPHENHYDRAMINE HCL 12.5 MG/5ML PO ELIX: 12.5000 mg | ORAL_SOLUTION | Freq: Four times a day (QID) | ORAL | Status: DC | PRN

## 2019-09-28 MED ORDER — ACETAMINOPHEN 500 MG PO TABS
ORAL_TABLET | ORAL | Status: AC
  Filled 2019-09-28: qty 2

## 2019-09-28 MED ORDER — LOSARTAN POTASSIUM 25 MG PO TABS
25.0000 mg | ORAL_TABLET | Freq: Every day | ORAL | Status: DC
  Administered 2019-09-28: 25 mg via ORAL
  Filled 2019-09-28: qty 1

## 2019-09-28 MED ORDER — PROPOFOL 10 MG/ML IV BOLUS
INTRAVENOUS | Status: DC | PRN
  Administered 2019-09-28: 150 mg via INTRAVENOUS

## 2019-09-28 MED ORDER — DEXAMETHASONE SODIUM PHOSPHATE 10 MG/ML IJ SOLN
INTRAMUSCULAR | Status: DC | PRN
  Administered 2019-09-28: 4 mg via INTRAVENOUS

## 2019-09-28 MED ORDER — ONDANSETRON HCL 4 MG PO TABS: 4.0000 mg | ORAL_TABLET | Freq: Four times a day (QID) | ORAL | Status: DC | PRN

## 2019-09-28 MED ORDER — ACETAMINOPHEN 500 MG PO TABS
1000.0000 mg | ORAL_TABLET | Freq: Once | ORAL | Status: AC
  Administered 2019-09-28: 1000 mg via ORAL

## 2019-09-28 MED ORDER — CELECOXIB 200 MG PO CAPS
200.0000 mg | ORAL_CAPSULE | Freq: Two times a day (BID) | ORAL | Status: DC
  Administered 2019-09-28: 200 mg via ORAL

## 2019-09-28 MED ORDER — LIDOCAINE 2% (20 MG/ML) 5 ML SYRINGE
INTRAMUSCULAR | Status: DC | PRN
  Administered 2019-09-28: 60 mg via INTRAVENOUS

## 2019-09-28 MED ORDER — SODIUM CHLORIDE 0.9% FLUSH: 9.0000 mL | INTRAVENOUS | Status: DC | PRN

## 2019-09-28 MED ORDER — ROSUVASTATIN CALCIUM 5 MG PO TABS
5.0000 mg | ORAL_TABLET | Freq: Every day | ORAL | Status: DC
  Filled 2019-09-28: qty 1

## 2019-09-28 MED ORDER — KETOROLAC TROMETHAMINE 30 MG/ML IJ SOLN
30.0000 mg | Freq: Once | INTRAMUSCULAR | Status: AC
  Administered 2019-09-28: 30 mg via INTRAVENOUS

## 2019-09-28 MED ORDER — ROCURONIUM BROMIDE 10 MG/ML (PF) SYRINGE
PREFILLED_SYRINGE | INTRAVENOUS | Status: DC | PRN
  Administered 2019-09-28: 50 mg via INTRAVENOUS

## 2019-09-28 MED ORDER — PROPOFOL 500 MG/50ML IV EMUL
INTRAVENOUS | Status: AC
  Filled 2019-09-28: qty 50

## 2019-09-28 MED ORDER — DEXTROSE IN LACTATED RINGERS 5 % IV SOLN: INTRAVENOUS | Status: DC

## 2019-09-28 MED ORDER — SODIUM CHLORIDE 0.9 % IV SOLN
2.0000 g | INTRAVENOUS | Status: AC
  Administered 2019-09-28: 2 g via INTRAVENOUS

## 2019-09-28 MED ORDER — SCOPOLAMINE 1 MG/3DAYS TD PT72
1.0000 | MEDICATED_PATCH | TRANSDERMAL | Status: DC
  Administered 2019-09-28: 1.5 mg via TRANSDERMAL

## 2019-09-28 MED ORDER — HYDROCODONE-ACETAMINOPHEN 5-325 MG PO TABS
ORAL_TABLET | ORAL | Status: AC
  Filled 2019-09-28: qty 1

## 2019-09-28 MED ORDER — SODIUM CHLORIDE (PF) 0.9 % IJ SOLN
INTRAMUSCULAR | Status: DC | PRN
  Administered 2019-09-28: 30 mL

## 2019-09-28 MED ORDER — SCOPOLAMINE 1 MG/3DAYS TD PT72
MEDICATED_PATCH | TRANSDERMAL | Status: AC
  Filled 2019-09-28: qty 1

## 2019-09-28 MED ORDER — DEXAMETHASONE SODIUM PHOSPHATE 10 MG/ML IJ SOLN
INTRAMUSCULAR | Status: AC
  Filled 2019-09-28: qty 1

## 2019-09-28 MED ORDER — KETOROLAC TROMETHAMINE 30 MG/ML IJ SOLN
INTRAMUSCULAR | Status: AC
  Filled 2019-09-28: qty 1

## 2019-09-28 MED ORDER — ROCURONIUM BROMIDE 10 MG/ML (PF) SYRINGE
PREFILLED_SYRINGE | INTRAVENOUS | Status: AC
  Filled 2019-09-28: qty 10

## 2019-09-28 MED ORDER — GABAPENTIN 100 MG PO CAPS
200.0000 mg | ORAL_CAPSULE | Freq: Two times a day (BID) | ORAL | Status: DC
  Administered 2019-09-28 (×2): 200 mg via ORAL

## 2019-09-28 MED ORDER — BUPIVACAINE LIPOSOME 1.3 % IJ SUSP
INTRAMUSCULAR | Status: DC | PRN
  Administered 2019-09-28: 20 mL

## 2019-09-28 MED ORDER — MORPHINE SULFATE 2 MG/ML IV SOLN
INTRAVENOUS | Status: DC
  Filled 2019-09-28: qty 60

## 2019-09-28 MED ORDER — FENTANYL CITRATE (PF) 250 MCG/5ML IJ SOLN
INTRAMUSCULAR | Status: AC
  Filled 2019-09-28: qty 5

## 2019-09-28 MED ORDER — METFORMIN HCL 500 MG PO TABS
500.0000 mg | ORAL_TABLET | Freq: Two times a day (BID) | ORAL | Status: DC
  Administered 2019-09-28: 500 mg via ORAL
  Filled 2019-09-28: qty 1

## 2019-09-28 MED ORDER — SODIUM CHLORIDE 0.9 % IV SOLN
INTRAVENOUS | Status: AC
  Filled 2019-09-28: qty 2

## 2019-09-28 MED ORDER — AMISULPRIDE (ANTIEMETIC) 5 MG/2ML IV SOLN
10.0000 mg | Freq: Once | INTRAVENOUS | Status: AC | PRN
  Administered 2019-09-28: 10 mg via INTRAVENOUS

## 2019-09-28 MED ORDER — FENTANYL CITRATE (PF) 100 MCG/2ML IJ SOLN
25.0000 ug | INTRAMUSCULAR | Status: DC | PRN
  Administered 2019-09-28 (×4): 25 ug via INTRAVENOUS

## 2019-09-28 MED ORDER — ONDANSETRON HCL 4 MG/2ML IJ SOLN
INTRAMUSCULAR | Status: DC | PRN
  Administered 2019-09-28: 4 mg via INTRAVENOUS

## 2019-09-28 SURGICAL SUPPLY — 53 items
APL SKNCLS STERI-STRIP NONHPOA (GAUZE/BANDAGES/DRESSINGS) ×1
BARRIER ADHS 3X4 INTERCEED (GAUZE/BANDAGES/DRESSINGS) IMPLANT
BENZOIN TINCTURE PRP APPL 2/3 (GAUZE/BANDAGES/DRESSINGS) ×2 IMPLANT
BLADE EXTENDED COATED 6.5IN (ELECTRODE) IMPLANT
BLADE HEX COATED 2.75 (ELECTRODE) ×2 IMPLANT
BRR ADH 4X3 ABS CNTRL BYND (GAUZE/BANDAGES/DRESSINGS)
CANISTER SUCT 3000ML PPV (MISCELLANEOUS) ×2 IMPLANT
COVER WAND RF STERILE (DRAPES) ×2 IMPLANT
DECANTER SPIKE VIAL GLASS SM (MISCELLANEOUS) IMPLANT
DRAPE WARM FLUID 44X44 (DRAPES) ×2 IMPLANT
DRSG OPSITE POSTOP 4X10 (GAUZE/BANDAGES/DRESSINGS) ×1 IMPLANT
DRSG PAD ABDOMINAL 8X10 ST (GAUZE/BANDAGES/DRESSINGS) ×2 IMPLANT
DURAPREP 26ML APPLICATOR (WOUND CARE) ×2 IMPLANT
GAUZE SPONGE 4X4 12PLY STRL (GAUZE/BANDAGES/DRESSINGS) ×2 IMPLANT
GLOVE BIO SURGEON STRL SZ 6 (GLOVE) ×1 IMPLANT
GLOVE BIO SURGEON STRL SZ7 (GLOVE) ×4 IMPLANT
GLOVE BIO SURGEON STRL SZ7.5 (GLOVE) ×1 IMPLANT
GLOVE BIOGEL PI IND STRL 7.5 (GLOVE) IMPLANT
GLOVE BIOGEL PI INDICATOR 7.5 (GLOVE) ×3
GOWN STRL REUS W/ TWL LRG LVL3 (GOWN DISPOSABLE) ×3 IMPLANT
GOWN STRL REUS W/TWL LRG LVL3 (GOWN DISPOSABLE) ×6
HOLDER FOLEY CATH W/STRAP (MISCELLANEOUS) ×2 IMPLANT
KIT TURNOVER CYSTO (KITS) ×2 IMPLANT
MANIPULATOR UTERINE 4.5 ZUMI (MISCELLANEOUS) IMPLANT
NDL SAFETY ECLIPSE 18X1.5 (NEEDLE) IMPLANT
NDL SPNL 22GX3.5 QUINCKE BK (NEEDLE) ×1 IMPLANT
NEEDLE HYPO 18GX1.5 SHARP (NEEDLE)
NEEDLE HYPO 22GX1.5 SAFETY (NEEDLE) ×2 IMPLANT
NEEDLE SPNL 22GX3.5 QUINCKE BK (NEEDLE) ×2 IMPLANT
NS IRRIG 500ML POUR BTL (IV SOLUTION) ×4 IMPLANT
PACK ABDOMINAL GYN (CUSTOM PROCEDURE TRAY) ×2 IMPLANT
PAD OB MATERNITY 4.3X12.25 (PERSONAL CARE ITEMS) ×2 IMPLANT
SPONGE LAP 4X18 RFD (DISPOSABLE) ×2 IMPLANT
STAPLER VISISTAT 35W (STAPLE) IMPLANT
STRIP CLOSURE SKIN 1/2X4 (GAUZE/BANDAGES/DRESSINGS) IMPLANT
SUT MNCRL AB 4-0 PS2 18 (SUTURE) ×1 IMPLANT
SUT PDS AB 0 CT1 27 (SUTURE) ×2 IMPLANT
SUT VIC AB 0 CT1 18XCR BRD8 (SUTURE) IMPLANT
SUT VIC AB 0 CT1 27 (SUTURE) ×2
SUT VIC AB 0 CT1 27XBRD ANBCTR (SUTURE) IMPLANT
SUT VIC AB 0 CT1 8-18 (SUTURE) ×6
SUT VIC AB 2-0 CT1 (SUTURE) ×1 IMPLANT
SUT VIC AB 2-0 CT1 27 (SUTURE)
SUT VIC AB 2-0 CT1 TAPERPNT 27 (SUTURE) IMPLANT
SUT VIC AB 3-0 CT1 27 (SUTURE)
SUT VIC AB 3-0 CT1 TAPERPNT 27 (SUTURE) IMPLANT
SUT VICRYL 0 TIES 12 18 (SUTURE) ×2 IMPLANT
SYR 10ML LL (SYRINGE) ×4 IMPLANT
SYR 50ML LL SCALE MARK (SYRINGE) IMPLANT
SYR CONTROL 10ML LL (SYRINGE) ×2 IMPLANT
TOWEL OR 17X26 10 PK STRL BLUE (TOWEL DISPOSABLE) ×2 IMPLANT
TRAY FOLEY W/BAG SLVR 14FR LF (SET/KITS/TRAYS/PACK) ×1 IMPLANT
WATER STERILE IRR 500ML POUR (IV SOLUTION) ×2 IMPLANT

## 2019-09-28 NOTE — Op Note (Signed)
Preop diagnosis: Symptomatic leiomyoma  Postoperative diagnosis: Same  Procedure: TAH, bilateral salpingectomy  Surgeon: Matthew Saras  Assistant: Morris  EBL: 100 cc  Specimens removed: Uterus, bilateral tubes  Procedure and findings:  The patient was taken to the operating room after an adequate level of general anesthesia was obtained with the patient supine the abdomen prepped and draped in the usual fashion, vagina was prepped separately and Foley catheter was position.  Appropriate timeouts were taken at that point.  Transverse incision was made 2 fingerbreadths above the symphysis carried down to the fascia which was incised and extended transversely.  Rectus muscles divided in the midline, peritoneum entered superiorly without incident and extended in a vertical fashion.  Uterus was enlarged 12 to 14 weeks size with irregular fibroids, was mobile.  We were able to elevate the enlarged uterus through the incision without self-retaining retractor.  Starting on the left the round ligament identified clamped divided and suture ligated with 0 Vicryl.  The peritoneum carried around to the midline anteriorly.  Both utero-ovarian pedicles were clamped.  An avascular portion of the broad ligament on the left was perforated with the surgeon's finger, the left utero-ovarian pedicle was isolated clamped divided first free tie followed by suture ligature 0 Vicryl.  This was hemostatic a small portion of tube was excised and sutured to complete the salpingectomy.  The exact same repeated on the opposite side, thus both ovaries were conserved.  Peritoneum was carried around the midline, using sharp and blunt dissection the bladder flap was developed, the bladder was dissected below the cervical vaginal junction.  In sequential manner staying close to the uterus, the ascending branch of the uterine artery, cardinal ligament, uterosacral ligament and cervical vaginal pedicles were clamped divided and  suture-ligated with 0 Vicryl and the specimen was thus excised.  Vaginal cuff was closed with closed with interrupted 2-0 Vicryl sutures with complete hemostasis.  The pelvis was irrigated with saline and observe the operative site was noted to be hemostatic.  Prior to closure sponge, needle, instrument counts reported as correct x2.  Peritoneum closed with a 2-0 Vicryl running suture of the same to reapproximate the rectus muscles in the midline 0 PDS from laterally to midline on either side to close the fascia.  Diluted Exparel solution with quarter percent Marcaine and saline was then injected subfascially and subcutaneously for postoperative analgesia.  Subcutaneous tissue was fairly thin and hemostatic was not closed separately 4-0 Monocryl subcuticular closure with a honeycomb dressing applied.  Clear urine noted at the end the case.  Dictated with Dragon Medical one  Ashley Asal MD

## 2019-09-28 NOTE — Progress Notes (Signed)
The patient was re-examined with no change in status 

## 2019-09-28 NOTE — Progress Notes (Signed)
RN wasted 6 ml of morphine from PCA syringe.  Witnessed by Edsel Petrin, RN.

## 2019-09-28 NOTE — Anesthesia Postprocedure Evaluation (Signed)
Anesthesia Post Note  Patient: Ashley Snyder  Procedure(s) Performed: HYSTERECTOMY ABDOMINAL WITH  SALPINGECTOMY (Bilateral Abdomen)     Patient location during evaluation: PACU Anesthesia Type: General Level of consciousness: awake and alert Pain management: pain level controlled Vital Signs Assessment: post-procedure vital signs reviewed and stable Respiratory status: spontaneous breathing, nonlabored ventilation, respiratory function stable and patient connected to nasal cannula oxygen Cardiovascular status: blood pressure returned to baseline and stable Postop Assessment: no apparent nausea or vomiting Anesthetic complications: no   No complications documented.  Last Vitals:  Vitals:   09/28/19 1054 09/28/19 1137  BP:  (!) 150/80  Pulse:  80  Resp: 18 20  Temp:  37.2 C  SpO2: 100% 100%    Last Pain:  Vitals:   09/28/19 1137  TempSrc:   PainSc: 6                  Tiajuana Amass

## 2019-09-28 NOTE — Anesthesia Procedure Notes (Signed)
Procedure Name: Intubation Date/Time: 09/28/2019 7:27 AM Performed by: Genelle Bal, CRNA Pre-anesthesia Checklist: Patient identified, Emergency Drugs available, Suction available and Patient being monitored Patient Re-evaluated:Patient Re-evaluated prior to induction Oxygen Delivery Method: Circle system utilized Preoxygenation: Pre-oxygenation with 100% oxygen Induction Type: IV induction Ventilation: Mask ventilation without difficulty Laryngoscope Size: Miller and 2 Grade View: Grade I Tube type: Oral Tube size: 7.0 mm Number of attempts: 1 Airway Equipment and Method: Stylet and Oral airway Placement Confirmation: ETT inserted through vocal cords under direct vision,  positive ETCO2 and breath sounds checked- equal and bilateral Secured at: 22 cm Tube secured with: Tape Dental Injury: Teeth and Oropharynx as per pre-operative assessment

## 2019-09-28 NOTE — Transfer of Care (Signed)
Immediate Anesthesia Transfer of Care Note  Patient: Ashley Snyder  Procedure(s) Performed: HYSTERECTOMY ABDOMINAL WITH  SALPINGECTOMY (Bilateral Abdomen)  Patient Location: PACU  Anesthesia Type:General  Level of Consciousness: awake, alert  and oriented  Airway & Oxygen Therapy: Patient Spontanous Breathing and Patient connected to face mask oxygen  Post-op Assessment: Report given to RN and Post -op Vital signs reviewed and stable  Post vital signs: Reviewed and stable  Last Vitals:  Vitals Value Taken Time  BP 142/98 09/28/19 0846  Temp 36.6 C 09/28/19 0845  Pulse 75 09/28/19 0854  Resp 27 09/28/19 0854  SpO2 97 % 09/28/19 0854  Vitals shown include unvalidated device data.  Last Pain:  Vitals:   09/28/19 0845  TempSrc:   PainSc: Asleep         Complications: No complications documented.

## 2019-09-29 DIAGNOSIS — D259 Leiomyoma of uterus, unspecified: Secondary | ICD-10-CM | POA: Diagnosis not present

## 2019-09-29 DIAGNOSIS — Z79899 Other long term (current) drug therapy: Secondary | ICD-10-CM | POA: Diagnosis not present

## 2019-09-29 LAB — CBC
HCT: 34 % — ABNORMAL LOW (ref 36.0–46.0)
Hemoglobin: 11 g/dL — ABNORMAL LOW (ref 12.0–15.0)
MCH: 27.7 pg (ref 26.0–34.0)
MCHC: 32.4 g/dL (ref 30.0–36.0)
MCV: 85.6 fL (ref 80.0–100.0)
Platelets: 257 10*3/uL (ref 150–400)
RBC: 3.97 MIL/uL (ref 3.87–5.11)
RDW: 12.9 % (ref 11.5–15.5)
WBC: 11.4 10*3/uL — ABNORMAL HIGH (ref 4.0–10.5)
nRBC: 0 % (ref 0.0–0.2)

## 2019-09-29 LAB — SURGICAL PATHOLOGY

## 2019-09-29 MED ORDER — HYDROCODONE-ACETAMINOPHEN 5-325 MG PO TABS: 1.0000 | ORAL_TABLET | ORAL | 0 refills | Status: DC | PRN

## 2019-09-29 MED ORDER — HYDROCODONE-ACETAMINOPHEN 5-325 MG PO TABS
ORAL_TABLET | ORAL | Status: AC
  Filled 2019-09-29: qty 1

## 2019-09-29 MED ORDER — IBUPROFEN 200 MG PO TABS: 800.0000 mg | ORAL_TABLET | Freq: Three times a day (TID) | ORAL | 1 refills | Status: AC | PRN

## 2019-09-29 NOTE — Discharge Summary (Signed)
Physician Discharge Summary  Patient ID: Ashley Snyder MRN: [id number] DOB/AGE: [id number] 50 y.o.  Admit date: 09/28/2019 Discharge date: 09/29/2019  Admission [place]  Discharge Diagnoses: same Active Problems:   Leiomyoma   Leiomyoma of uterus   Discharged Condition: good  Hospital Course: adm for TAH/BS>>>on POD 1 was afeb, tol PO and ready for D/C  Consults: None  Significant Diagnostic Studies: labs:  Results for orders placed or performed during the hospital encounter of 09/28/19 (from the past 24 hour(s))  Glucose, capillary     Status: Abnormal   Collection Time: 09/28/19  8:50 AM  Result Value Ref Range   Glucose-Capillary 150 (H) 70 - 99 mg/dL  CBC     Status: Abnormal   Collection Time: 09/29/19  6:20 AM  Result Value Ref Range   WBC 11.4 (H) 4.0 - 10.5 K/uL   RBC 3.97 3.87 - 5.11 MIL/uL   Hemoglobin 11.0 (L) 12.0 - 15.0 g/dL   HCT 34.0 (L) 36 - 46 %   MCV 85.6 80.0 - 100.0 fL   MCH 27.7 26.0 - 34.0 pg   MCHC 32.4 30.0 - 36.0 g/dL   RDW 12.9 11.5 - 15.5 %   Platelets 257 150 - 400 K/uL   nRBC 0.0 0.0 - 0.2 %    Treatments: surgery: TAH/BS  Discharge Exam: Blood pressure 123/66, pulse 79, temperature 99.7 F (37.6 C), resp. rate 18, height 5\' 7"  (1.702 m), weight 81 kg, SpO2 97 %. abd soft + BS, dressing dry  Disposition: Discharge disposition: 01-Home or Self Care          Follow-up Information    Molli Posey, MD. Schedule an appointment as soon as possible for a visit in 1 week(s).   Specialty: Obstetrics and Gynecology Contact information: [address] [place] [address] [phone]               Signed: Margarette Asal 09/29/2019, 8:19 AM

## 2019-09-29 NOTE — Discharge Instructions (Signed)
Abdominal Hysterectomy, Care After This sheet gives you information about how to care for yourself after your procedure. Your doctor may also give you more specific instructions. If you have problems or questions, contact your doctor. Follow these instructions at home: Bathing  Do not take baths, swim, or use a hot tub until your doctor says it is okay. Ask your doctor if you can take showers. You may only be allowed to take sponge baths for bathing.  Keep the bandage (dressing) dry until your doctor says it can be taken off. Surgical cut (incision) care      Follow instructions from your doctor about how to take care of your cut from surgery. Make sure you: ? Wash your hands with soap and water before you change your bandage (dressing). If you cannot use soap and water, use hand sanitizer. ? Change your bandage as told by your doctor. ? Leave stitches (sutures), skin glue, or skin tape (adhesive) strips in place. They may need to stay in place for 2 weeks or longer. If tape strips get loose and curl up, you may trim the loose edges. Do not remove tape strips completely unless your doctor says it is okay.  Check your surgical cut area every day for signs of infection. Check for: ? Redness, swelling, or pain. ? Fluid or blood. ? Warmth. ? Pus or a bad smell. Activity  Do gentle, daily exercise as told by your doctor. You may be told to take short walks every day and go farther each time.  Do not lift anything that is heavier than 10 lb (4.5 kg), or the limit that your doctor tells you, until he or she says that it is safe.  Do not drive or use heavy machinery while taking prescription pain medicine.  Do not drive for 24 hours if you were given a medicine to help you relax (sedative).  Follow your doctor's advice about exercise, driving, and general activities. Ask your doctor what activities are safe for you. Lifestyle   Do not douche, use tampons, or have sex for at least 6 weeks  or as told by your doctor.  Do not drink alcohol until your doctor says it is okay.  Drink enough fluid to keep your pee (urine) clear or pale yellow.  Try to have someone at home with you for the first 1-2 weeks to help.  Do not use any products that contain nicotine or tobacco, such as cigarettes and e-cigarettes. These can slow down healing. If you need help quitting, ask your doctor. General instructions  Take over-the-counter and prescription medicines only as told by your doctor.  Do not take aspirin or ibuprofen. These medicines can cause bleeding.  To prevent or treat constipation while you are taking prescription pain medicine, your doctor may suggest that you: ? Drink enough fluid to keep your urine clear or pale yellow. ? Take over-the-counter or prescription medicines. ? Eat foods that are high in fiber, such as:  Fresh fruits and vegetables.  Whole grains.  Beans. ? Limit foods that are high in fat and processed sugars, such as fried and sweet foods.  Keep all follow-up visits as told by your doctor. This is important. Contact a doctor if:  You have chills or fever.  You have redness, swelling, or pain around your cut.  You have fluid or blood coming from your cut.  Your cut feels warm to the touch.  You have pus or a bad smell coming from your cut.    Your cut breaks open.  You feel dizzy or light-headed.  You have pain or bleeding when you pee.  You keep having watery poop (diarrhea).  You keep feeling sick to your stomach (nauseous) or keep throwing up (vomiting).  You have unusual fluid (discharge) coming from your vagina.  You have a rash.  You have a reaction to your medicine.  Your pain medicine does not help. Get help right away if:  You have a fever and your symptoms get worse all of a sudden.  You have very bad belly (abdominal) pain.  You are short of breath.  You pass out (faint).  You have pain, swelling, or redness of your  leg.  You bleed a lot from your vagina and notice clumps of blood (clots). Summary  Do not take baths, swim, or use a hot tub until your doctor says it is okay. Ask your doctor if you can take showers. You may only be allowed to take sponge baths for bathing.  Follow your doctor's advice about exercise, driving, and general activities. Ask your doctor what activities are safe for you.  Do not lift anything that is heavier than 10 lb (4.5 kg), or the limit that your doctor tells you, until he or she says that it is safe.  Try to have someone at home with you for the first 1-2 weeks to help. This information is not intended to replace advice given to you by your health care provider. Make sure you discuss any questions you have with your health care provider. Document Revised: 04/24/2018 Document Reviewed: 02/08/2016 Elsevier Patient Education  2020 [employer]  

## 2019-09-30 ENCOUNTER — Encounter (HOSPITAL_BASED_OUTPATIENT_CLINIC_OR_DEPARTMENT_OTHER): Payer: Self-pay | Admitting: Obstetrics and Gynecology

## 2019-10-03 ENCOUNTER — Ambulatory Visit: Payer: Self-pay

## 2019-10-05 DIAGNOSIS — D509 Iron deficiency anemia, unspecified: Secondary | ICD-10-CM | POA: Diagnosis not present

## 2019-10-08 DIAGNOSIS — F4323 Adjustment disorder with mixed anxiety and depressed mood: Secondary | ICD-10-CM | POA: Diagnosis not present

## 2019-10-13 DIAGNOSIS — Z713 Dietary counseling and surveillance: Secondary | ICD-10-CM | POA: Diagnosis not present

## 2019-10-20 NOTE — Patient Instructions (Addendum)
Allergic rhinitis(positive to tree pollen-oak) Continue avoidance measures Start ipratropium bromide nasal spray 0.03% using 2 sprays in each nostril 2 to 3 times a day as needed for drainage. Continue Zyrtec 10 mg once a day as needed for runny nose or itching Stop Dymista Continue saline rinse to help with nasal symptoms.  Use this prior to any medicated sprays. Start Flonase 2 sprays each nostril once a day as needed for stuffy nose  Scalp pruritus Continue to follow up with dermatologist  Please let us know if this treatment plan is not working well for you. Schedule follow-up appointment in 6 months

## 2019-10-21 ENCOUNTER — Other Ambulatory Visit: Payer: Self-pay

## 2019-10-21 ENCOUNTER — Encounter: Payer: Self-pay | Admitting: Family

## 2019-10-21 ENCOUNTER — Ambulatory Visit: Payer: Federal, State, Local not specified - PPO | Admitting: Family

## 2019-10-21 VITALS — BP 126/82 | HR 87 | Temp 97.9°F | Resp 16

## 2019-10-21 DIAGNOSIS — J301 Allergic rhinitis due to pollen: Secondary | ICD-10-CM | POA: Diagnosis not present

## 2019-10-21 DIAGNOSIS — L299 Pruritus, unspecified: Secondary | ICD-10-CM

## 2019-10-21 MED ORDER — IPRATROPIUM BROMIDE 0.03 % NA SOLN: NASAL | 12 refills | Status: AC

## 2019-10-21 MED ORDER — FLUTICASONE PROPIONATE 50 MCG/ACT NA SUSP
NASAL | 5 refills | Status: AC
Start: 2019-10-21 — End: ?

## 2019-10-21 NOTE — Progress Notes (Signed)
[place] [id number] Dept: [phone]  FOLLOW UP NOTE  Patient ID: Crystal Scarberry Snyder, female    DOB: [birth year]  Age: 49 y.o. MRN: [id number] Date of Office Visit: 10/21/2019  Assessment  Chief Complaint: Allergic Rhinitis   HPI Ashley Snyder is a 49 year old female who presents today for follow-up of allergic rhinitis.  She was last seen on June 18, 2019 by Dr. Nelva Bush.  Allergic rhinitis is reported as not well controlled with the use of Zyrtec 10 mg once a day as needed, sinus rinse as needed, and Dymista nasal spray as needed.  She reports that a couple weeks in May the drainage seemed to go away, but then she had Covid in May the drainage came back constantly for a period of time.  In June the mucus in her throat went away, but she now feels like it is starting to come back, but not as bad.  She also reports a clear rhinorrhea first in the morning, nasal congestion sometimes at night and first thing in the morning, and occasional sneezing.  She does not feel that the Dymista nasal spray or the sinus rinse helped with her drainage.  She tried the Dymista nose spray consistently for a month with no resolution of symptoms.  Her allergy symptoms tend to flare September through October, then the month of December, then February through March.  She is also not certain if she mentioned to Dr. Nelva Bush at her last office visit about her scalp itching.  She reports that her itchy scalp coincides when her allergy symptoms occur.  She is currently seeing a dermatologist who recommended that she take Zyrtec to help with itching and she reports that this does help.  Her dermatologist also recommended scalp injections, but she is not interested in that option at this point in time.  Current medications are as listed in the chart.   Drug Allergies:  No Known Allergies  Review of Systems: Review of Systems  Constitutional: Negative for chills and fever.    HENT: Positive for congestion.        Reports post nasal drip and clear rhinorrhea  Eyes:       Denies itchy watery eyes  Respiratory: Negative for cough, shortness of breath and wheezing.   Cardiovascular: Negative for chest pain and palpitations.  Gastrointestinal:       Reports reflux  Genitourinary: Negative for dysuria.  Skin: Negative for itching and rash.  Neurological: Negative for headaches.  Endo/Heme/Allergies: Positive for environmental allergies.    Physical Exam: BP 126/82   Pulse 87   Temp 97.9 F (36.6 C) (Temporal)   Resp 16   SpO2 98%    Physical Exam Constitutional:      Appearance: Normal appearance.  HENT:     Head: Normocephalic and atraumatic.     Comments: Pharynx normal. Eyes normal. Ears normal. Nose normal.    Right Ear: Tympanic membrane, ear canal and external ear normal.     Left Ear: Tympanic membrane, ear canal and external ear normal.     Nose: Nose normal.     Mouth/Throat:     Mouth: Mucous membranes are moist.     Pharynx: Oropharynx is clear.  Eyes:     Conjunctiva/sclera: Conjunctivae normal.  Cardiovascular:     Rate and Rhythm: Normal rate and regular rhythm.     Heart sounds: Normal heart sounds.  Pulmonary:     Effort: Pulmonary effort is normal.  Breath sounds: Normal breath sounds.     Comments: Lungs clear to auscultation Musculoskeletal:     Cervical back: Neck supple.  Skin:    Comments: No erythema or rash noted on scalp  Neurological:     Mental Status: She is alert and oriented to person, place, and time.  Psychiatric:        Mood and Affect: Mood normal.        Behavior: Behavior normal.        Thought Content: Thought content normal.        Judgment: Judgment normal.     Diagnostics:  None  Assessment and Plan: 1. Seasonal allergic rhinitis due to pollen   2. Pruritus of scalp     No orders of the defined types were placed in this encounter.   Patient Instructions  Allergic rhinitis(positive  to tree pollen-oak) Continue avoidance measures Start ipratropium bromide nasal spray 0.03% using 2 sprays in each nostril 2 to 3 times a day as needed for drainage. Continue Zyrtec 10 mg once a day as needed for runny nose or itching Stop Dymista Continue saline rinse to help with nasal symptoms.  Use this prior to any medicated sprays. Start Flonase 2 sprays each nostril once a day as needed for stuffy nose  Scalp pruritus Continue to follow up with dermatologist  Please let us know if this treatment plan is not working well for you. Schedule follow-up appointment in 6 months   Return in about 6 months (around 04/22/2020), or if symptoms worsen or fail to improve.    Thank you for the opportunity to care for this patient.  Please do not hesitate to contact me with questions.  Althea Charon, FNP [facility]

## 2019-10-27 DIAGNOSIS — F4323 Adjustment disorder with mixed anxiety and depressed mood: Secondary | ICD-10-CM | POA: Diagnosis not present

## 2019-10-28 DIAGNOSIS — N951 Menopausal and female climacteric states: Secondary | ICD-10-CM | POA: Diagnosis not present

## 2019-11-11 DIAGNOSIS — F4323 Adjustment disorder with mixed anxiety and depressed mood: Secondary | ICD-10-CM | POA: Diagnosis not present

## 2019-11-25 DIAGNOSIS — F4323 Adjustment disorder with mixed anxiety and depressed mood: Secondary | ICD-10-CM | POA: Diagnosis not present

## 2019-11-27 DIAGNOSIS — M25519 Pain in unspecified shoulder: Secondary | ICD-10-CM | POA: Diagnosis not present

## 2019-11-27 DIAGNOSIS — Z23 Encounter for immunization: Secondary | ICD-10-CM | POA: Diagnosis not present

## 2019-12-09 DIAGNOSIS — F4323 Adjustment disorder with mixed anxiety and depressed mood: Secondary | ICD-10-CM | POA: Diagnosis not present

## 2019-12-23 DIAGNOSIS — F4323 Adjustment disorder with mixed anxiety and depressed mood: Secondary | ICD-10-CM | POA: Diagnosis not present

## 2020-01-07 DIAGNOSIS — B078 Other viral warts: Secondary | ICD-10-CM | POA: Diagnosis not present

## 2020-01-14 ENCOUNTER — Ambulatory Visit
Admission: RE | Admit: 2020-01-14 | Discharge: 2020-01-14 | Disposition: A | Discharge status: Home / Self Care | Payer: Federal, State, Local not specified - PPO | Source: Ambulatory Visit | Attending: Internal Medicine | Admitting: Internal Medicine

## 2020-01-14 ENCOUNTER — Other Ambulatory Visit: Payer: Self-pay | Admitting: Internal Medicine

## 2020-01-14 DIAGNOSIS — E1169 Type 2 diabetes mellitus with other specified complication: Secondary | ICD-10-CM | POA: Diagnosis not present

## 2020-01-14 DIAGNOSIS — I10 Essential (primary) hypertension: Secondary | ICD-10-CM | POA: Diagnosis not present

## 2020-01-14 DIAGNOSIS — R109 Unspecified abdominal pain: Secondary | ICD-10-CM

## 2020-01-14 DIAGNOSIS — R102 Pelvic and perineal pain: Secondary | ICD-10-CM | POA: Diagnosis not present

## 2020-01-14 DIAGNOSIS — E7800 Pure hypercholesterolemia, unspecified: Secondary | ICD-10-CM | POA: Diagnosis not present

## 2020-01-14 DIAGNOSIS — Z0000 Encounter for general adult medical examination without abnormal findings: Secondary | ICD-10-CM | POA: Diagnosis not present

## 2020-01-26 DIAGNOSIS — M7541 Impingement syndrome of right shoulder: Secondary | ICD-10-CM | POA: Diagnosis not present

## 2020-02-02 DIAGNOSIS — F4323 Adjustment disorder with mixed anxiety and depressed mood: Secondary | ICD-10-CM | POA: Diagnosis not present

## 2020-02-10 DIAGNOSIS — H9311 Tinnitus, right ear: Secondary | ICD-10-CM | POA: Diagnosis not present

## 2020-02-16 DIAGNOSIS — F4323 Adjustment disorder with mixed anxiety and depressed mood: Secondary | ICD-10-CM | POA: Diagnosis not present

## 2020-02-24 DIAGNOSIS — N760 Acute vaginitis: Secondary | ICD-10-CM | POA: Diagnosis not present

## 2020-02-24 DIAGNOSIS — Z01419 Encounter for gynecological examination (general) (routine) without abnormal findings: Secondary | ICD-10-CM | POA: Diagnosis not present

## 2020-02-24 DIAGNOSIS — Z1231 Encounter for screening mammogram for malignant neoplasm of breast: Secondary | ICD-10-CM | POA: Diagnosis not present

## 2020-03-07 DIAGNOSIS — R1114 Bilious vomiting: Secondary | ICD-10-CM | POA: Diagnosis not present

## 2020-03-14 ENCOUNTER — Other Ambulatory Visit (HOSPITAL_COMMUNITY): Payer: Self-pay | Admitting: Gastroenterology

## 2020-03-14 ENCOUNTER — Other Ambulatory Visit: Payer: Self-pay | Admitting: Gastroenterology

## 2020-03-14 DIAGNOSIS — Z8719 Personal history of other diseases of the digestive system: Secondary | ICD-10-CM | POA: Diagnosis not present

## 2020-03-14 DIAGNOSIS — R112 Nausea with vomiting, unspecified: Secondary | ICD-10-CM

## 2020-03-31 ENCOUNTER — Encounter (HOSPITAL_COMMUNITY): Payer: Self-pay

## 2020-03-31 ENCOUNTER — Ambulatory Visit (HOSPITAL_COMMUNITY): Payer: Federal, State, Local not specified - PPO

## 2020-04-04 ENCOUNTER — Other Ambulatory Visit: Payer: Self-pay

## 2020-04-04 ENCOUNTER — Encounter (HOSPITAL_COMMUNITY): Payer: Self-pay

## 2020-04-04 ENCOUNTER — Ambulatory Visit (HOSPITAL_COMMUNITY): Payer: Federal, State, Local not specified - PPO

## 2020-04-04 ENCOUNTER — Ambulatory Visit (HOSPITAL_COMMUNITY)
Admission: RE | Admit: 2020-04-04 | Discharge: 2020-04-04 | Disposition: A | Discharge status: Home / Self Care | Payer: Federal, State, Local not specified - PPO | Source: Ambulatory Visit | Attending: Gastroenterology | Admitting: Gastroenterology

## 2020-04-04 DIAGNOSIS — R112 Nausea with vomiting, unspecified: Secondary | ICD-10-CM

## 2020-04-04 DIAGNOSIS — K30 Functional dyspepsia: Secondary | ICD-10-CM | POA: Diagnosis not present

## 2020-04-04 MED ORDER — TECHNETIUM TC 99M SULFUR COLLOID
2.1400 | Freq: Once | INTRAVENOUS | Status: AC | PRN
  Administered 2020-04-04: 2.14 via ORAL

## 2020-04-13 DIAGNOSIS — F4323 Adjustment disorder with mixed anxiety and depressed mood: Secondary | ICD-10-CM | POA: Diagnosis not present

## 2020-05-04 DIAGNOSIS — F4323 Adjustment disorder with mixed anxiety and depressed mood: Secondary | ICD-10-CM | POA: Diagnosis not present

## 2020-05-08 DIAGNOSIS — Z20822 Contact with and (suspected) exposure to covid-19: Secondary | ICD-10-CM | POA: Diagnosis not present

## 2020-05-08 DIAGNOSIS — Z03818 Encounter for observation for suspected exposure to other biological agents ruled out: Secondary | ICD-10-CM | POA: Diagnosis not present

## 2020-05-18 DIAGNOSIS — F4323 Adjustment disorder with mixed anxiety and depressed mood: Secondary | ICD-10-CM | POA: Diagnosis not present

## 2020-05-24 DIAGNOSIS — M25511 Pain in right shoulder: Secondary | ICD-10-CM | POA: Diagnosis not present

## 2020-06-01 DIAGNOSIS — F4323 Adjustment disorder with mixed anxiety and depressed mood: Secondary | ICD-10-CM | POA: Diagnosis not present

## 2020-06-06 DIAGNOSIS — K3184 Gastroparesis: Secondary | ICD-10-CM | POA: Diagnosis not present

## 2020-06-06 DIAGNOSIS — K219 Gastro-esophageal reflux disease without esophagitis: Secondary | ICD-10-CM | POA: Diagnosis not present

## 2020-06-08 DIAGNOSIS — F4323 Adjustment disorder with mixed anxiety and depressed mood: Secondary | ICD-10-CM | POA: Diagnosis not present

## 2020-06-15 DIAGNOSIS — M25519 Pain in unspecified shoulder: Secondary | ICD-10-CM | POA: Diagnosis not present

## 2020-07-05 DIAGNOSIS — M25511 Pain in right shoulder: Secondary | ICD-10-CM | POA: Diagnosis not present

## 2020-07-05 DIAGNOSIS — M75120 Complete rotator cuff tear or rupture of unspecified shoulder, not specified as traumatic: Secondary | ICD-10-CM | POA: Diagnosis not present

## 2020-07-14 DIAGNOSIS — K3184 Gastroparesis: Secondary | ICD-10-CM | POA: Diagnosis not present

## 2020-07-14 DIAGNOSIS — E7800 Pure hypercholesterolemia, unspecified: Secondary | ICD-10-CM | POA: Diagnosis not present

## 2020-07-14 DIAGNOSIS — I10 Essential (primary) hypertension: Secondary | ICD-10-CM | POA: Diagnosis not present

## 2020-07-14 DIAGNOSIS — E1169 Type 2 diabetes mellitus with other specified complication: Secondary | ICD-10-CM | POA: Diagnosis not present

## 2020-10-27 DIAGNOSIS — M75121 Complete rotator cuff tear or rupture of right shoulder, not specified as traumatic: Secondary | ICD-10-CM | POA: Diagnosis not present

## 2020-11-24 DIAGNOSIS — G43009 Migraine without aura, not intractable, without status migrainosus: Secondary | ICD-10-CM | POA: Diagnosis not present

## 2021-01-19 DIAGNOSIS — Z23 Encounter for immunization: Secondary | ICD-10-CM | POA: Diagnosis not present

## 2021-01-19 DIAGNOSIS — K3184 Gastroparesis: Secondary | ICD-10-CM | POA: Diagnosis not present

## 2021-01-19 DIAGNOSIS — Z0000 Encounter for general adult medical examination without abnormal findings: Secondary | ICD-10-CM | POA: Diagnosis not present

## 2021-01-19 DIAGNOSIS — E1169 Type 2 diabetes mellitus with other specified complication: Secondary | ICD-10-CM | POA: Diagnosis not present

## 2021-01-19 DIAGNOSIS — G43009 Migraine without aura, not intractable, without status migrainosus: Secondary | ICD-10-CM | POA: Diagnosis not present

## 2021-01-19 DIAGNOSIS — Z7984 Long term (current) use of oral hypoglycemic drugs: Secondary | ICD-10-CM | POA: Diagnosis not present

## 2021-03-08 DIAGNOSIS — N760 Acute vaginitis: Secondary | ICD-10-CM | POA: Diagnosis not present

## 2021-03-08 DIAGNOSIS — Z01419 Encounter for gynecological examination (general) (routine) without abnormal findings: Secondary | ICD-10-CM | POA: Diagnosis not present

## 2021-03-08 DIAGNOSIS — Z1231 Encounter for screening mammogram for malignant neoplasm of breast: Secondary | ICD-10-CM | POA: Diagnosis not present

## 2021-04-04 DIAGNOSIS — J329 Chronic sinusitis, unspecified: Secondary | ICD-10-CM | POA: Diagnosis not present

## 2021-04-04 DIAGNOSIS — H6983 Other specified disorders of Eustachian tube, bilateral: Secondary | ICD-10-CM | POA: Diagnosis not present

## 2021-04-04 DIAGNOSIS — R059 Cough, unspecified: Secondary | ICD-10-CM | POA: Diagnosis not present

## 2021-04-20 DIAGNOSIS — R635 Abnormal weight gain: Secondary | ICD-10-CM | POA: Diagnosis not present

## 2021-04-20 DIAGNOSIS — Z6828 Body mass index (BMI) 28.0-28.9, adult: Secondary | ICD-10-CM | POA: Diagnosis not present

## 2021-05-30 DIAGNOSIS — M75121 Complete rotator cuff tear or rupture of right shoulder, not specified as traumatic: Secondary | ICD-10-CM | POA: Diagnosis not present

## 2021-05-30 DIAGNOSIS — M25511 Pain in right shoulder: Secondary | ICD-10-CM | POA: Diagnosis not present

## 2021-06-06 DIAGNOSIS — K219 Gastro-esophageal reflux disease without esophagitis: Secondary | ICD-10-CM | POA: Diagnosis not present

## 2021-06-06 DIAGNOSIS — K3184 Gastroparesis: Secondary | ICD-10-CM | POA: Diagnosis not present

## 2021-06-08 DIAGNOSIS — M25511 Pain in right shoulder: Secondary | ICD-10-CM | POA: Diagnosis not present

## 2021-06-13 DIAGNOSIS — M25511 Pain in right shoulder: Secondary | ICD-10-CM | POA: Diagnosis not present

## 2021-06-20 DIAGNOSIS — M75121 Complete rotator cuff tear or rupture of right shoulder, not specified as traumatic: Secondary | ICD-10-CM | POA: Diagnosis not present

## 2021-06-20 DIAGNOSIS — S43431D Superior glenoid labrum lesion of right shoulder, subsequent encounter: Secondary | ICD-10-CM | POA: Diagnosis not present

## 2021-06-27 ENCOUNTER — Emergency Department (HOSPITAL_BASED_OUTPATIENT_CLINIC_OR_DEPARTMENT_OTHER): Payer: Federal, State, Local not specified - PPO

## 2021-06-27 ENCOUNTER — Other Ambulatory Visit: Payer: Self-pay

## 2021-06-27 ENCOUNTER — Emergency Department (HOSPITAL_COMMUNITY): Payer: Federal, State, Local not specified - PPO

## 2021-06-27 ENCOUNTER — Encounter (HOSPITAL_COMMUNITY): Payer: Self-pay | Admitting: Emergency Medicine

## 2021-06-27 ENCOUNTER — Emergency Department (HOSPITAL_COMMUNITY)
Admission: EM | Admit: 2021-06-27 | Discharge: 2021-06-27 | Disposition: A | Discharge status: Home / Self Care | Payer: Federal, State, Local not specified - PPO | Attending: Emergency Medicine | Admitting: Emergency Medicine

## 2021-06-27 DIAGNOSIS — Z7984 Long term (current) use of oral hypoglycemic drugs: Secondary | ICD-10-CM | POA: Insufficient documentation

## 2021-06-27 DIAGNOSIS — R110 Nausea: Secondary | ICD-10-CM | POA: Diagnosis not present

## 2021-06-27 DIAGNOSIS — Z79899 Other long term (current) drug therapy: Secondary | ICD-10-CM | POA: Insufficient documentation

## 2021-06-27 DIAGNOSIS — R0789 Other chest pain: Secondary | ICD-10-CM | POA: Insufficient documentation

## 2021-06-27 DIAGNOSIS — R079 Chest pain, unspecified: Secondary | ICD-10-CM | POA: Diagnosis not present

## 2021-06-27 DIAGNOSIS — M79605 Pain in left leg: Secondary | ICD-10-CM

## 2021-06-27 DIAGNOSIS — I10 Essential (primary) hypertension: Secondary | ICD-10-CM | POA: Insufficient documentation

## 2021-06-27 DIAGNOSIS — E119 Type 2 diabetes mellitus without complications: Secondary | ICD-10-CM | POA: Insufficient documentation

## 2021-06-27 LAB — BASIC METABOLIC PANEL
Anion gap: 8 (ref 5–15)
BUN: 11 mg/dL (ref 6–20)
CO2: 27 mmol/L (ref 22–32)
Calcium: 9.7 mg/dL (ref 8.9–10.3)
Chloride: 102 mmol/L (ref 98–111)
Creatinine, Ser: 0.87 mg/dL (ref 0.44–1.00)
GFR, Estimated: >60 mL/min (ref >60)
Glucose, Bld: 117 mg/dL — ABNORMAL HIGH (ref 70–99)
Potassium: 4.4 mmol/L (ref 3.5–5.1)
Sodium: 137 mmol/L (ref 135–145)

## 2021-06-27 LAB — CBC
HCT: 42.5 % (ref 36.0–46.0)
Hemoglobin: 13.7 g/dL (ref 12.0–15.0)
MCH: 27.3 pg (ref 26.0–34.0)
MCHC: 32.2 g/dL (ref 30.0–36.0)
MCV: 84.7 fL (ref 80.0–100.0)
Platelets: 267 10*3/uL (ref 150–400)
RBC: 5.02 MIL/uL (ref 3.87–5.11)
RDW: 13 % (ref 11.5–15.5)
WBC: 8.2 10*3/uL (ref 4.0–10.5)
nRBC: 0 % (ref 0.0–0.2)

## 2021-06-27 LAB — TROPONIN I (HIGH SENSITIVITY): Troponin I (High Sensitivity): 3 ng/L (ref <18)

## 2021-06-27 NOTE — ED Notes (Signed)
Patient verbalizes understanding of discharge instructions. Opportunity for questioning and answers were provided. Armband removed by staff, pt discharged from ED.  

## 2021-06-27 NOTE — ED Provider Notes (Signed)
?[facility] ?Provider Note ? ? ?CSN: [id number] ?Arrival date & time: 06/27/21  1233 ? ?  ? ?History ? ?Chief Complaint  ?Patient presents with  ? Chest Pain  ? ? ?Ashley Snyder is a 51 y.o. female. ? ? ?Patient presents complaining of upper chest pain that radiates from right superior pectoral region to the left.  She states that she was lying down pain began.  She describes the chest pain as a stretching.  She denies any shortness of breath associated with the chest pain.  She states that she has been taking antinausea medication due to nausea associated with her gastroparesis.  Patient reports a history of diabetes and hypertension.  She states that she did not have any increased abdominal pain in association with the chest pain.  She denies any sweating.  She denies symptoms of heartburn and denies abdominal pain.  Patient reports that she was driving to urgent care for left calf pain when the chest pain began again today. She reports having left calf pain that is worse with flexing her toes. She denies personal or family hx of blood clots.  ?Patient never smoked tobacco.  She reports drinking 1 to 2 glasses of wine occasionally.  She denies any family history significant for heart disease.  She denies personal history of MI, stroke or TIA. ? ? ?  ? ?Home Medications ?Prior to Admission medications   ?Medication Sig Start Date End Date Taking? Authorizing Provider  ?ALPRAZolam (XANAX) 0.25 MG tablet Take 0.25 mg by mouth at bedtime as needed for anxiety.    [provider]  ?Azelastine-Fluticasone 137-50 MCG/ACT SUSP Place 1 spray into the nose 2 (two) times daily. ?Patient not taking: Reported on 10/21/2019 06/18/19   Kennith Gain, MD  ?cetirizine (ZYRTEC) 10 MG tablet Take 10 mg by mouth as needed for allergies.    [provider]  ?cyclobenzaprine (FLEXERIL) 10 MG tablet Take 10 mg by mouth 3 (three) times daily as needed for muscle  spasms.  06/13/19   [provider]  ?fluticasone (FLONASE) 50 MCG/ACT nasal spray 2 sprays each nostril once a day as needed for stuffy nose. 10/21/19   Althea Charon, [place]  ?HYDROcodone-acetaminophen (NORCO/VICODIN) 5-325 MG tablet Take 1-2 tablets by mouth every 4 (four) hours as needed for moderate pain. 09/29/19   Molli Posey, MD  ?ibuprofen (ADVIL) 200 MG tablet Take 4 tablets (800 mg total) by mouth every 8 (eight) hours as needed for moderate pain. 09/29/19   Molli Posey, MD  ?ipratropium (ATROVENT) 0.03 % nasal spray 2 sprays each nostril 2 to 3 times daily as needed for drainage. 10/21/19   Althea Charon, [place]  ?losartan (COZAAR) 25 MG tablet Take 25 mg by mouth daily. 04/20/19   [provider]  ?metFORMIN (GLUCOPHAGE) 500 MG tablet Take 500 mg by mouth 2 (two) times daily with a meal.     [provider]  ?metoprolol succinate (TOPROL-XL) 50 MG 24 hr tablet Take 50 mg by mouth daily. Take with or immediately following a meal.    [provider]  ?rosuvastatin (CRESTOR) 5 MG tablet Take 5 mg by mouth daily. 04/20/19   [provider]  ?SUMAtriptan (IMITREX) 25 MG tablet Take 25 mg by mouth every 2 (two) hours as needed for migraine. May repeat in 2 hours if headache persists or recurs.    [provider]  ?   ? ?Allergies    ?Patient has no known allergies.   ? ?  Review of Systems   ?Review of Systems  ?Constitutional:  Negative for diaphoresis and fever.  ?HENT:  Negative for trouble swallowing.   ?Respiratory:  Negative for shortness of breath.   ?Cardiovascular:  Positive for chest pain. Negative for palpitations.  ?Gastrointestinal:  Positive for abdominal pain and nausea.  ?Genitourinary:  Negative for dysuria.  ?Musculoskeletal:  Positive for myalgias.  ?     Left calf pain  ?Neurological:  Negative for dizziness and headaches.  ? ?Physical Exam ?Updated Vital Signs ?BP (!) 162/88   Pulse 80   Temp 98.9 ?F (37.2 ?C) (Oral)   Resp 19   Ht  '5\' 7"'$  (1.702 m)   Wt 76.2 kg   LMP 12/09/2013   SpO2 100%   BMI 26.31 kg/m?  ?Physical Exam ?Vitals and nursing note reviewed.  ?Constitutional:   ?   General: She is not in acute distress. ?   Appearance: She is well-developed. She is not ill-appearing or diaphoretic.  ?HENT:  ?   Head: Normocephalic and atraumatic.  ?Eyes:  ?   Conjunctiva/sclera: Conjunctivae normal.  ?Cardiovascular:  ?   Rate and Rhythm: Normal rate and regular rhythm.  ?   Pulses:     ?     Posterior tibial pulses are 2+ on the right side and 2+ on the left side.  ?   Heart sounds: No murmur heard. ?Pulmonary:  ?   Effort: Pulmonary effort is normal. No respiratory distress.  ?   Breath sounds: Normal breath sounds.  ?Chest:  ?   Chest wall: No tenderness.  ?Abdominal:  ?   General: Bowel sounds are normal.  ?   Palpations: Abdomen is soft.  ?   Tenderness: There is no abdominal tenderness.  ?Musculoskeletal:     ?   General: No swelling.  ?   Right lower leg: No tenderness. No edema.  ?   Left lower leg: No tenderness. No edema.  ?   Comments: No erythema noted on left calf   ?Skin: ?   General: Skin is warm and dry.  ?   Capillary Refill: Capillary refill takes less than 2 seconds.  ?   Findings: No erythema.  ?Neurological:  ?   Mental Status: She is alert and oriented to person, place, and time.  ? ? ?ED Results / Procedures / Treatments   ?Labs ?(all labs ordered are listed, but only abnormal results are displayed) ?Labs Reviewed  ?BASIC METABOLIC PANEL - Abnormal; Notable for the following components:  ?    Result Value  ? Glucose, Bld 117 (*)   ? All other components within normal limits  ?CBC  ?TROPONIN I (HIGH SENSITIVITY)  ? ? ?EKG ?EKG Interpretation ? ?Date/Time:  Tuesday June 27 2021 12:24:11 EDT ?Ventricular Rate:  75 ?PR Interval:  144 ?QRS Duration: 66 ?QT Interval:  350 ?QTC Calculation: 390 ?R Axis:   84 ?Text Interpretation: Normal sinus rhythm Septal infarct , age undetermined Abnormal ECG When compared with ECG of  17-Sep-2019 14:16, PREVIOUS ECG IS PRESENT No significant change since last tracing Confirmed by Isla Pence [phone]) on 06/27/2021 2:14:14 PM ? ?Radiology ?DG Chest 2 View ? ?Result Date: 06/27/2021 ?CLINICAL DATA:  Right chest pain EXAM: CHEST - 2 VIEW COMPARISON:  09/04/2014 FINDINGS: The heart size and mediastinal contours are within normal limits. Both lungs are clear. The visualized skeletal structures are unremarkable. IMPRESSION: No active cardiopulmonary disease. Electronically Signed   By: Elmer Picker M.D.   On: 06/27/2021  13:27   ? ?Procedures ?Procedures  ? ?Medications Ordered in ED ?Medications - No data to display ? ?ED Course/ Medical Decision Making/ A&P ?  ?                        ?Medical Decision Making ?51 yo F presenting with upper chest pain that began overnight and is not associated with SOB, diaphoresis or emesis. Patient with hx of DM and gastroparesis currently on antinausea medication. Vs notable for elevated BP (patient has HTN at baseline). CXR negative. Patient has 100% saturation on RA. Denies CP at time of exam, chest pain is not reproducible on palpation. EKG without signs of ischemia. Troponin level 3, patient denies GERD symptoms. Very low suspicion for ACS given the above labs, vitals and exam findings. DVT US ordered for left calf pain, results showed no DVT. HEART pathway with one point and considered low risk for 30 day major cardiac event. Will discharge patient home with recommendation for outpatient follow up or return to care if symptoms worsen or persist.  ?  ? ?Amount and/or Complexity of Data Reviewed ?Labs: ordered. ?Radiology: ordered. ? ? ? ?Final Clinical Impression(s) / ED Diagnoses ?Final diagnoses:  ?Chest wall pain  ? ? ?Rx / DC Orders ?ED Discharge Orders   ? ? None  ? ?  ? ? ?  ?Eulis Foster, MD ?06/27/21 1455 ? ?  ?Isla Pence, MD ?06/27/21 1459 ? ?

## 2021-06-27 NOTE — Discharge Instructions (Addendum)
You were evaluated today for chest and calf pain.  ? ?Your EKG did not show signs of abnormal electrical changes that would indicate damage to your heart tissue. Your lab work did not indicate signs of heart attack.   ? ?The ultrasound of your calf was negative for a DVT.  ? ?Please schedule follow up with your PCP in the  next week.  ? ? ?

## 2021-06-27 NOTE — ED Provider Triage Note (Signed)
Emergency Medicine Provider Triage Evaluation Note ? ?Ashley Snyder , a 51 y.o. female  was evaluated in triage.  Pt complains of chest pain since last night.  Patient states that she was sitting down watching TV when chest pain began.  Patient denies radiation, denies shortness of breath.  Patient states that she has been seen by cardiology in the past for arrhythmia.  Patient denies blood thinners.  Patient denies lower extremity swelling.  Patient does endorse left calf tenderness for the last week and a half.  Patient denies history of blood clots. ? ?Review of Systems  ?Positive:  ?Negative:  ? ?Physical Exam  ?BP (!) 142/96 (BP Location: Right Arm)   Pulse 82   Temp 98.9 ?F (37.2 ?C) (Oral)   Resp 16   Ht '5\' 7"'$  (1.702 m)   Wt 76.2 kg   LMP 12/09/2013   SpO2 100%   BMI 26.31 kg/m?  ?Gen:   Awake, no distress   ?Resp:  Normal effort  ?MSK:   Moves extremities without difficulty  ?Other:  No swelling on exam to lower extremities.  Lung sounds clear. ? ?Medical Decision Making  ?Medically screening exam initiated at 1:07 PM.  Appropriate orders placed.  Ashley Snyder was informed that the remainder of the evaluation will be completed by another provider, this initial triage assessment does not replace that evaluation, and the importance of remaining in the ED until their evaluation is complete. ? ? ?  ?Azucena Cecil, PA-C ?06/27/21 1308 ? ?

## 2021-06-27 NOTE — Progress Notes (Signed)
Lower extremity venous has been completed.  ? ?Preliminary results in CV Proc.  ? ?Ashley Snyder Self ?06/27/2021 3:28 PM    ?

## 2021-06-27 NOTE — ED Triage Notes (Signed)
Patient endorses right sided chest pain onset of last night while laying down. Describes pain as tight, pressure and intermittent. Reports nausea since last Monday. Saw GI and given PRN nausea meds.  ?

## 2021-06-27 NOTE — ED Notes (Addendum)
MD at [facility]. Pt alert, NAD, calm, interactive. Venous doppler into room, at [facility]. Endorses R calf pain. Denies CP, sob, NV, or other sx.  ?

## 2021-07-11 DIAGNOSIS — K3184 Gastroparesis: Secondary | ICD-10-CM | POA: Diagnosis not present

## 2021-07-11 DIAGNOSIS — E1169 Type 2 diabetes mellitus with other specified complication: Secondary | ICD-10-CM | POA: Diagnosis not present

## 2021-07-27 DIAGNOSIS — R0981 Nasal congestion: Secondary | ICD-10-CM | POA: Diagnosis not present

## 2021-07-27 DIAGNOSIS — E7800 Pure hypercholesterolemia, unspecified: Secondary | ICD-10-CM | POA: Diagnosis not present

## 2021-07-27 DIAGNOSIS — E1169 Type 2 diabetes mellitus with other specified complication: Secondary | ICD-10-CM | POA: Diagnosis not present

## 2021-07-27 DIAGNOSIS — I10 Essential (primary) hypertension: Secondary | ICD-10-CM | POA: Diagnosis not present

## 2021-10-02 ENCOUNTER — Ambulatory Visit
Admission: RE | Admit: 2021-10-02 | Discharge: 2021-10-02 | Disposition: A | Discharge status: Home / Self Care | Payer: Federal, State, Local not specified - PPO | Source: Ambulatory Visit | Attending: Internal Medicine | Admitting: Internal Medicine

## 2021-10-02 ENCOUNTER — Other Ambulatory Visit: Payer: Self-pay | Admitting: Internal Medicine

## 2021-10-02 DIAGNOSIS — R059 Cough, unspecified: Secondary | ICD-10-CM | POA: Diagnosis not present

## 2021-10-02 DIAGNOSIS — R0600 Dyspnea, unspecified: Secondary | ICD-10-CM

## 2021-10-11 DIAGNOSIS — D649 Anemia, unspecified: Secondary | ICD-10-CM | POA: Diagnosis not present

## 2021-11-20 DIAGNOSIS — G8918 Other acute postprocedural pain: Secondary | ICD-10-CM | POA: Diagnosis not present

## 2021-11-20 DIAGNOSIS — S43431A Superior glenoid labrum lesion of right shoulder, initial encounter: Secondary | ICD-10-CM | POA: Diagnosis not present

## 2021-11-20 DIAGNOSIS — M24111 Other articular cartilage disorders, right shoulder: Secondary | ICD-10-CM | POA: Diagnosis not present

## 2021-11-20 DIAGNOSIS — Y999 Unspecified external cause status: Secondary | ICD-10-CM | POA: Diagnosis not present

## 2021-11-20 DIAGNOSIS — M948X1 Other specified disorders of cartilage, shoulder: Secondary | ICD-10-CM | POA: Diagnosis not present

## 2021-11-20 DIAGNOSIS — M7501 Adhesive capsulitis of right shoulder: Secondary | ICD-10-CM | POA: Diagnosis not present

## 2021-11-20 DIAGNOSIS — X58XXXA Exposure to other specified factors, initial encounter: Secondary | ICD-10-CM | POA: Diagnosis not present

## 2021-11-20 DIAGNOSIS — S46011A Strain of muscle(s) and tendon(s) of the rotator cuff of right shoulder, initial encounter: Secondary | ICD-10-CM | POA: Diagnosis not present

## 2021-11-21 DIAGNOSIS — M25611 Stiffness of right shoulder, not elsewhere classified: Secondary | ICD-10-CM | POA: Diagnosis not present

## 2021-11-21 DIAGNOSIS — M25511 Pain in right shoulder: Secondary | ICD-10-CM | POA: Diagnosis not present

## 2021-11-28 DIAGNOSIS — M25611 Stiffness of right shoulder, not elsewhere classified: Secondary | ICD-10-CM | POA: Diagnosis not present

## 2021-11-28 DIAGNOSIS — M25511 Pain in right shoulder: Secondary | ICD-10-CM | POA: Diagnosis not present

## 2021-12-05 DIAGNOSIS — M25611 Stiffness of right shoulder, not elsewhere classified: Secondary | ICD-10-CM | POA: Diagnosis not present

## 2021-12-05 DIAGNOSIS — M25511 Pain in right shoulder: Secondary | ICD-10-CM | POA: Diagnosis not present

## 2021-12-12 DIAGNOSIS — M25511 Pain in right shoulder: Secondary | ICD-10-CM | POA: Diagnosis not present

## 2021-12-12 DIAGNOSIS — M25611 Stiffness of right shoulder, not elsewhere classified: Secondary | ICD-10-CM | POA: Diagnosis not present

## 2021-12-19 DIAGNOSIS — M25611 Stiffness of right shoulder, not elsewhere classified: Secondary | ICD-10-CM | POA: Diagnosis not present

## 2021-12-21 DIAGNOSIS — M25511 Pain in right shoulder: Secondary | ICD-10-CM | POA: Diagnosis not present

## 2021-12-21 DIAGNOSIS — M25611 Stiffness of right shoulder, not elsewhere classified: Secondary | ICD-10-CM | POA: Diagnosis not present

## 2021-12-22 DIAGNOSIS — Z23 Encounter for immunization: Secondary | ICD-10-CM | POA: Diagnosis not present

## 2021-12-26 DIAGNOSIS — M25511 Pain in right shoulder: Secondary | ICD-10-CM | POA: Diagnosis not present

## 2021-12-26 DIAGNOSIS — M25611 Stiffness of right shoulder, not elsewhere classified: Secondary | ICD-10-CM | POA: Diagnosis not present

## 2021-12-28 DIAGNOSIS — M25611 Stiffness of right shoulder, not elsewhere classified: Secondary | ICD-10-CM | POA: Diagnosis not present

## 2021-12-28 DIAGNOSIS — M25511 Pain in right shoulder: Secondary | ICD-10-CM | POA: Diagnosis not present

## 2022-01-02 DIAGNOSIS — M25611 Stiffness of right shoulder, not elsewhere classified: Secondary | ICD-10-CM | POA: Diagnosis not present

## 2022-01-02 DIAGNOSIS — M25511 Pain in right shoulder: Secondary | ICD-10-CM | POA: Diagnosis not present

## 2022-01-04 DIAGNOSIS — M25511 Pain in right shoulder: Secondary | ICD-10-CM | POA: Diagnosis not present

## 2022-01-04 DIAGNOSIS — M25611 Stiffness of right shoulder, not elsewhere classified: Secondary | ICD-10-CM | POA: Diagnosis not present

## 2022-01-08 DIAGNOSIS — M25611 Stiffness of right shoulder, not elsewhere classified: Secondary | ICD-10-CM | POA: Diagnosis not present

## 2022-01-08 DIAGNOSIS — M25511 Pain in right shoulder: Secondary | ICD-10-CM | POA: Diagnosis not present

## 2022-01-10 DIAGNOSIS — D492 Neoplasm of unspecified behavior of bone, soft tissue, and skin: Secondary | ICD-10-CM | POA: Diagnosis not present

## 2022-01-10 DIAGNOSIS — L821 Other seborrheic keratosis: Secondary | ICD-10-CM | POA: Diagnosis not present

## 2022-01-11 DIAGNOSIS — M25611 Stiffness of right shoulder, not elsewhere classified: Secondary | ICD-10-CM | POA: Diagnosis not present

## 2022-01-11 DIAGNOSIS — M25511 Pain in right shoulder: Secondary | ICD-10-CM | POA: Diagnosis not present

## 2022-01-15 DIAGNOSIS — M25511 Pain in right shoulder: Secondary | ICD-10-CM | POA: Diagnosis not present

## 2022-01-15 DIAGNOSIS — M25611 Stiffness of right shoulder, not elsewhere classified: Secondary | ICD-10-CM | POA: Diagnosis not present

## 2022-01-17 DIAGNOSIS — M25611 Stiffness of right shoulder, not elsewhere classified: Secondary | ICD-10-CM | POA: Diagnosis not present

## 2022-01-17 DIAGNOSIS — M75121 Complete rotator cuff tear or rupture of right shoulder, not specified as traumatic: Secondary | ICD-10-CM | POA: Diagnosis not present

## 2022-01-22 DIAGNOSIS — M25611 Stiffness of right shoulder, not elsewhere classified: Secondary | ICD-10-CM | POA: Diagnosis not present

## 2022-01-22 DIAGNOSIS — M25511 Pain in right shoulder: Secondary | ICD-10-CM | POA: Diagnosis not present

## 2022-01-24 DIAGNOSIS — M25511 Pain in right shoulder: Secondary | ICD-10-CM | POA: Diagnosis not present

## 2022-01-24 DIAGNOSIS — M25611 Stiffness of right shoulder, not elsewhere classified: Secondary | ICD-10-CM | POA: Diagnosis not present

## 2022-01-29 DIAGNOSIS — M25511 Pain in right shoulder: Secondary | ICD-10-CM | POA: Diagnosis not present

## 2022-01-29 DIAGNOSIS — M25611 Stiffness of right shoulder, not elsewhere classified: Secondary | ICD-10-CM | POA: Diagnosis not present

## 2022-01-31 DIAGNOSIS — M25611 Stiffness of right shoulder, not elsewhere classified: Secondary | ICD-10-CM | POA: Diagnosis not present

## 2022-01-31 DIAGNOSIS — M25511 Pain in right shoulder: Secondary | ICD-10-CM | POA: Diagnosis not present

## 2022-02-05 DIAGNOSIS — M25511 Pain in right shoulder: Secondary | ICD-10-CM | POA: Diagnosis not present

## 2022-02-05 DIAGNOSIS — M25611 Stiffness of right shoulder, not elsewhere classified: Secondary | ICD-10-CM | POA: Diagnosis not present

## 2022-02-07 DIAGNOSIS — M25611 Stiffness of right shoulder, not elsewhere classified: Secondary | ICD-10-CM | POA: Diagnosis not present

## 2022-02-13 DIAGNOSIS — M542 Cervicalgia: Secondary | ICD-10-CM | POA: Diagnosis not present

## 2022-02-13 DIAGNOSIS — M6281 Muscle weakness (generalized): Secondary | ICD-10-CM | POA: Diagnosis not present

## 2022-02-13 DIAGNOSIS — M25511 Pain in right shoulder: Secondary | ICD-10-CM | POA: Diagnosis not present

## 2022-02-15 DIAGNOSIS — M25511 Pain in right shoulder: Secondary | ICD-10-CM | POA: Diagnosis not present

## 2022-02-15 DIAGNOSIS — M542 Cervicalgia: Secondary | ICD-10-CM | POA: Diagnosis not present

## 2022-02-15 DIAGNOSIS — M6281 Muscle weakness (generalized): Secondary | ICD-10-CM | POA: Diagnosis not present

## 2022-02-20 DIAGNOSIS — M542 Cervicalgia: Secondary | ICD-10-CM | POA: Diagnosis not present

## 2022-02-20 DIAGNOSIS — M6281 Muscle weakness (generalized): Secondary | ICD-10-CM | POA: Diagnosis not present

## 2022-02-20 DIAGNOSIS — M25511 Pain in right shoulder: Secondary | ICD-10-CM | POA: Diagnosis not present

## 2022-02-22 DIAGNOSIS — M25511 Pain in right shoulder: Secondary | ICD-10-CM | POA: Diagnosis not present

## 2022-02-22 DIAGNOSIS — M542 Cervicalgia: Secondary | ICD-10-CM | POA: Diagnosis not present

## 2022-02-22 DIAGNOSIS — M6281 Muscle weakness (generalized): Secondary | ICD-10-CM | POA: Diagnosis not present

## 2022-03-01 DIAGNOSIS — K219 Gastro-esophageal reflux disease without esophagitis: Secondary | ICD-10-CM | POA: Diagnosis not present

## 2022-03-01 DIAGNOSIS — J302 Other seasonal allergic rhinitis: Secondary | ICD-10-CM | POA: Diagnosis not present

## 2022-03-02 DIAGNOSIS — M542 Cervicalgia: Secondary | ICD-10-CM | POA: Diagnosis not present

## 2022-03-02 DIAGNOSIS — M25511 Pain in right shoulder: Secondary | ICD-10-CM | POA: Diagnosis not present

## 2022-03-02 DIAGNOSIS — M6281 Muscle weakness (generalized): Secondary | ICD-10-CM | POA: Diagnosis not present

## 2022-03-06 DIAGNOSIS — Z4789 Encounter for other orthopedic aftercare: Secondary | ICD-10-CM | POA: Diagnosis not present

## 2022-03-07 DIAGNOSIS — M6281 Muscle weakness (generalized): Secondary | ICD-10-CM | POA: Diagnosis not present

## 2022-03-07 DIAGNOSIS — M542 Cervicalgia: Secondary | ICD-10-CM | POA: Diagnosis not present

## 2022-03-07 DIAGNOSIS — M25511 Pain in right shoulder: Secondary | ICD-10-CM | POA: Diagnosis not present

## 2022-03-08 DIAGNOSIS — M542 Cervicalgia: Secondary | ICD-10-CM | POA: Diagnosis not present

## 2022-03-08 DIAGNOSIS — M25511 Pain in right shoulder: Secondary | ICD-10-CM | POA: Diagnosis not present

## 2022-03-08 DIAGNOSIS — M6281 Muscle weakness (generalized): Secondary | ICD-10-CM | POA: Diagnosis not present

## 2022-03-13 DIAGNOSIS — M6281 Muscle weakness (generalized): Secondary | ICD-10-CM | POA: Diagnosis not present

## 2022-03-13 DIAGNOSIS — M542 Cervicalgia: Secondary | ICD-10-CM | POA: Diagnosis not present

## 2022-03-13 DIAGNOSIS — M25511 Pain in right shoulder: Secondary | ICD-10-CM | POA: Diagnosis not present

## 2022-03-15 DIAGNOSIS — M25511 Pain in right shoulder: Secondary | ICD-10-CM | POA: Diagnosis not present

## 2022-03-15 DIAGNOSIS — M6281 Muscle weakness (generalized): Secondary | ICD-10-CM | POA: Diagnosis not present

## 2022-03-15 DIAGNOSIS — M542 Cervicalgia: Secondary | ICD-10-CM | POA: Diagnosis not present

## 2022-03-21 DIAGNOSIS — M24611 Ankylosis, right shoulder: Secondary | ICD-10-CM | POA: Diagnosis not present

## 2022-03-21 DIAGNOSIS — T8482XA Fibrosis due to internal orthopedic prosthetic devices, implants and grafts, initial encounter: Secondary | ICD-10-CM | POA: Diagnosis not present

## 2022-03-21 DIAGNOSIS — G8918 Other acute postprocedural pain: Secondary | ICD-10-CM | POA: Diagnosis not present

## 2022-03-21 DIAGNOSIS — Z96611 Presence of right artificial shoulder joint: Secondary | ICD-10-CM | POA: Diagnosis not present

## 2022-03-22 DIAGNOSIS — M25511 Pain in right shoulder: Secondary | ICD-10-CM | POA: Diagnosis not present

## 2022-03-22 DIAGNOSIS — M6281 Muscle weakness (generalized): Secondary | ICD-10-CM | POA: Diagnosis not present

## 2022-03-22 DIAGNOSIS — M542 Cervicalgia: Secondary | ICD-10-CM | POA: Diagnosis not present

## 2022-03-23 DIAGNOSIS — M25511 Pain in right shoulder: Secondary | ICD-10-CM | POA: Diagnosis not present

## 2022-03-23 DIAGNOSIS — M542 Cervicalgia: Secondary | ICD-10-CM | POA: Diagnosis not present

## 2022-03-23 DIAGNOSIS — M6281 Muscle weakness (generalized): Secondary | ICD-10-CM | POA: Diagnosis not present

## 2022-03-26 DIAGNOSIS — M542 Cervicalgia: Secondary | ICD-10-CM | POA: Diagnosis not present

## 2022-03-26 DIAGNOSIS — M6281 Muscle weakness (generalized): Secondary | ICD-10-CM | POA: Diagnosis not present

## 2022-03-26 DIAGNOSIS — M25511 Pain in right shoulder: Secondary | ICD-10-CM | POA: Diagnosis not present

## 2022-03-30 DIAGNOSIS — M25511 Pain in right shoulder: Secondary | ICD-10-CM | POA: Diagnosis not present

## 2022-03-30 DIAGNOSIS — M6281 Muscle weakness (generalized): Secondary | ICD-10-CM | POA: Diagnosis not present

## 2022-03-30 DIAGNOSIS — M542 Cervicalgia: Secondary | ICD-10-CM | POA: Diagnosis not present

## 2022-04-02 DIAGNOSIS — M25511 Pain in right shoulder: Secondary | ICD-10-CM | POA: Diagnosis not present

## 2022-04-02 DIAGNOSIS — M542 Cervicalgia: Secondary | ICD-10-CM | POA: Diagnosis not present

## 2022-04-02 DIAGNOSIS — M6281 Muscle weakness (generalized): Secondary | ICD-10-CM | POA: Diagnosis not present

## 2022-04-03 DIAGNOSIS — M545 Low back pain, unspecified: Secondary | ICD-10-CM | POA: Diagnosis not present

## 2022-04-09 DIAGNOSIS — M6281 Muscle weakness (generalized): Secondary | ICD-10-CM | POA: Diagnosis not present

## 2022-04-09 DIAGNOSIS — M25511 Pain in right shoulder: Secondary | ICD-10-CM | POA: Diagnosis not present

## 2022-04-09 DIAGNOSIS — M542 Cervicalgia: Secondary | ICD-10-CM | POA: Diagnosis not present

## 2022-04-12 DIAGNOSIS — M25511 Pain in right shoulder: Secondary | ICD-10-CM | POA: Diagnosis not present

## 2022-04-12 DIAGNOSIS — M6281 Muscle weakness (generalized): Secondary | ICD-10-CM | POA: Diagnosis not present

## 2022-04-12 DIAGNOSIS — M542 Cervicalgia: Secondary | ICD-10-CM | POA: Diagnosis not present

## 2022-04-16 DIAGNOSIS — M25511 Pain in right shoulder: Secondary | ICD-10-CM | POA: Diagnosis not present

## 2022-04-16 DIAGNOSIS — M6281 Muscle weakness (generalized): Secondary | ICD-10-CM | POA: Diagnosis not present

## 2022-04-16 DIAGNOSIS — M542 Cervicalgia: Secondary | ICD-10-CM | POA: Diagnosis not present

## 2022-04-19 DIAGNOSIS — M6281 Muscle weakness (generalized): Secondary | ICD-10-CM | POA: Diagnosis not present

## 2022-04-19 DIAGNOSIS — M25511 Pain in right shoulder: Secondary | ICD-10-CM | POA: Diagnosis not present

## 2022-04-19 DIAGNOSIS — M542 Cervicalgia: Secondary | ICD-10-CM | POA: Diagnosis not present

## 2022-04-23 DIAGNOSIS — M6281 Muscle weakness (generalized): Secondary | ICD-10-CM | POA: Diagnosis not present

## 2022-04-23 DIAGNOSIS — M542 Cervicalgia: Secondary | ICD-10-CM | POA: Diagnosis not present

## 2022-04-23 DIAGNOSIS — M25511 Pain in right shoulder: Secondary | ICD-10-CM | POA: Diagnosis not present

## 2022-04-26 DIAGNOSIS — E1169 Type 2 diabetes mellitus with other specified complication: Secondary | ICD-10-CM | POA: Diagnosis not present

## 2022-04-26 DIAGNOSIS — Z0000 Encounter for general adult medical examination without abnormal findings: Secondary | ICD-10-CM | POA: Diagnosis not present

## 2022-04-26 DIAGNOSIS — K219 Gastro-esophageal reflux disease without esophagitis: Secondary | ICD-10-CM | POA: Diagnosis not present

## 2022-04-26 DIAGNOSIS — E7800 Pure hypercholesterolemia, unspecified: Secondary | ICD-10-CM | POA: Diagnosis not present

## 2022-04-26 DIAGNOSIS — M542 Cervicalgia: Secondary | ICD-10-CM | POA: Diagnosis not present

## 2022-04-26 DIAGNOSIS — M25511 Pain in right shoulder: Secondary | ICD-10-CM | POA: Diagnosis not present

## 2022-04-26 DIAGNOSIS — K3184 Gastroparesis: Secondary | ICD-10-CM | POA: Diagnosis not present

## 2022-04-26 DIAGNOSIS — M6281 Muscle weakness (generalized): Secondary | ICD-10-CM | POA: Diagnosis not present

## 2022-05-01 DIAGNOSIS — M6281 Muscle weakness (generalized): Secondary | ICD-10-CM | POA: Diagnosis not present

## 2022-05-01 DIAGNOSIS — M542 Cervicalgia: Secondary | ICD-10-CM | POA: Diagnosis not present

## 2022-05-01 DIAGNOSIS — M25511 Pain in right shoulder: Secondary | ICD-10-CM | POA: Diagnosis not present

## 2022-05-02 DIAGNOSIS — Z01419 Encounter for gynecological examination (general) (routine) without abnormal findings: Secondary | ICD-10-CM | POA: Diagnosis not present

## 2022-05-02 DIAGNOSIS — Z1231 Encounter for screening mammogram for malignant neoplasm of breast: Secondary | ICD-10-CM | POA: Diagnosis not present

## 2022-05-09 DIAGNOSIS — M542 Cervicalgia: Secondary | ICD-10-CM | POA: Diagnosis not present

## 2022-05-09 DIAGNOSIS — M25511 Pain in right shoulder: Secondary | ICD-10-CM | POA: Diagnosis not present

## 2022-05-09 DIAGNOSIS — M6281 Muscle weakness (generalized): Secondary | ICD-10-CM | POA: Diagnosis not present

## 2022-05-15 DIAGNOSIS — M542 Cervicalgia: Secondary | ICD-10-CM | POA: Diagnosis not present

## 2022-05-15 DIAGNOSIS — M25511 Pain in right shoulder: Secondary | ICD-10-CM | POA: Diagnosis not present

## 2022-05-15 DIAGNOSIS — M6281 Muscle weakness (generalized): Secondary | ICD-10-CM | POA: Diagnosis not present

## 2022-05-22 DIAGNOSIS — M6281 Muscle weakness (generalized): Secondary | ICD-10-CM | POA: Diagnosis not present

## 2022-05-22 DIAGNOSIS — M25511 Pain in right shoulder: Secondary | ICD-10-CM | POA: Diagnosis not present

## 2022-05-22 DIAGNOSIS — M542 Cervicalgia: Secondary | ICD-10-CM | POA: Diagnosis not present

## 2022-06-05 DIAGNOSIS — M542 Cervicalgia: Secondary | ICD-10-CM | POA: Diagnosis not present

## 2022-06-05 DIAGNOSIS — M6281 Muscle weakness (generalized): Secondary | ICD-10-CM | POA: Diagnosis not present

## 2022-06-05 DIAGNOSIS — M25511 Pain in right shoulder: Secondary | ICD-10-CM | POA: Diagnosis not present

## 2022-10-30 DIAGNOSIS — I10 Essential (primary) hypertension: Secondary | ICD-10-CM | POA: Diagnosis not present

## 2022-10-30 DIAGNOSIS — K219 Gastro-esophageal reflux disease without esophagitis: Secondary | ICD-10-CM | POA: Diagnosis not present

## 2022-10-30 DIAGNOSIS — G43009 Migraine without aura, not intractable, without status migrainosus: Secondary | ICD-10-CM | POA: Diagnosis not present

## 2022-10-30 DIAGNOSIS — E7800 Pure hypercholesterolemia, unspecified: Secondary | ICD-10-CM | POA: Diagnosis not present

## 2022-10-30 DIAGNOSIS — E1169 Type 2 diabetes mellitus with other specified complication: Secondary | ICD-10-CM | POA: Diagnosis not present

## 2022-11-14 DIAGNOSIS — E162 Hypoglycemia, unspecified: Secondary | ICD-10-CM | POA: Diagnosis not present

## 2023-02-14 DIAGNOSIS — E119 Type 2 diabetes mellitus without complications: Secondary | ICD-10-CM | POA: Diagnosis not present

## 2023-05-07 DIAGNOSIS — Z23 Encounter for immunization: Secondary | ICD-10-CM | POA: Diagnosis not present

## 2023-05-07 DIAGNOSIS — Z0000 Encounter for general adult medical examination without abnormal findings: Secondary | ICD-10-CM | POA: Diagnosis not present

## 2023-05-07 DIAGNOSIS — K3184 Gastroparesis: Secondary | ICD-10-CM | POA: Diagnosis not present

## 2023-05-07 DIAGNOSIS — E7800 Pure hypercholesterolemia, unspecified: Secondary | ICD-10-CM | POA: Diagnosis not present

## 2023-05-07 DIAGNOSIS — E1169 Type 2 diabetes mellitus with other specified complication: Secondary | ICD-10-CM | POA: Diagnosis not present

## 2023-05-20 DIAGNOSIS — G5792 Unspecified mononeuropathy of left lower limb: Secondary | ICD-10-CM | POA: Diagnosis not present

## 2023-05-22 DIAGNOSIS — Z6826 Body mass index (BMI) 26.0-26.9, adult: Secondary | ICD-10-CM | POA: Diagnosis not present

## 2023-05-22 DIAGNOSIS — N760 Acute vaginitis: Secondary | ICD-10-CM | POA: Diagnosis not present

## 2023-05-22 DIAGNOSIS — Z1231 Encounter for screening mammogram for malignant neoplasm of breast: Secondary | ICD-10-CM | POA: Diagnosis not present

## 2023-05-22 DIAGNOSIS — Z01419 Encounter for gynecological examination (general) (routine) without abnormal findings: Secondary | ICD-10-CM | POA: Diagnosis not present

## 2023-05-22 DIAGNOSIS — R82998 Other abnormal findings in urine: Secondary | ICD-10-CM | POA: Diagnosis not present

## 2023-07-21 ENCOUNTER — Encounter (HOSPITAL_COMMUNITY): Payer: Self-pay

## 2023-07-21 ENCOUNTER — Emergency Department (HOSPITAL_COMMUNITY)
Admission: EM | Admit: 2023-07-21 | Discharge: 2023-07-21 | Disposition: A | Discharge status: Home / Self Care | Attending: Emergency Medicine | Admitting: Emergency Medicine

## 2023-07-21 ENCOUNTER — Ambulatory Visit (HOSPITAL_COMMUNITY)
Admission: EM | Admit: 2023-07-21 | Discharge: 2023-07-21 | Disposition: A | Discharge status: Home / Self Care | Attending: Family Medicine | Admitting: Family Medicine

## 2023-07-21 ENCOUNTER — Encounter (HOSPITAL_COMMUNITY): Payer: Self-pay | Admitting: Emergency Medicine

## 2023-07-21 ENCOUNTER — Emergency Department (HOSPITAL_COMMUNITY)

## 2023-07-21 ENCOUNTER — Other Ambulatory Visit: Payer: Self-pay

## 2023-07-21 DIAGNOSIS — E119 Type 2 diabetes mellitus without complications: Secondary | ICD-10-CM | POA: Diagnosis not present

## 2023-07-21 DIAGNOSIS — N83202 Unspecified ovarian cyst, left side: Secondary | ICD-10-CM | POA: Diagnosis not present

## 2023-07-21 DIAGNOSIS — K529 Noninfective gastroenteritis and colitis, unspecified: Secondary | ICD-10-CM | POA: Diagnosis not present

## 2023-07-21 DIAGNOSIS — I10 Essential (primary) hypertension: Secondary | ICD-10-CM | POA: Diagnosis not present

## 2023-07-21 DIAGNOSIS — K625 Hemorrhage of anus and rectum: Secondary | ICD-10-CM

## 2023-07-21 DIAGNOSIS — R1031 Right lower quadrant pain: Secondary | ICD-10-CM

## 2023-07-21 DIAGNOSIS — K922 Gastrointestinal hemorrhage, unspecified: Secondary | ICD-10-CM | POA: Insufficient documentation

## 2023-07-21 DIAGNOSIS — R1030 Lower abdominal pain, unspecified: Secondary | ICD-10-CM

## 2023-07-21 DIAGNOSIS — Z79899 Other long term (current) drug therapy: Secondary | ICD-10-CM | POA: Diagnosis not present

## 2023-07-21 DIAGNOSIS — Z7984 Long term (current) use of oral hypoglycemic drugs: Secondary | ICD-10-CM | POA: Diagnosis not present

## 2023-07-21 LAB — CBC WITH DIFFERENTIAL/PLATELET
Abs Immature Granulocytes: 0.02 10*3/uL (ref 0.00–0.07)
Basophils Absolute: 0 10*3/uL (ref 0.0–0.1)
Basophils Relative: 0 %
Eosinophils Absolute: 0.1 10*3/uL (ref 0.0–0.5)
Eosinophils Relative: 2 %
HCT: 42.8 % (ref 36.0–46.0)
Hemoglobin: 13.7 g/dL (ref 12.0–15.0)
Immature Granulocytes: 0 %
Lymphocytes Relative: 28 %
Lymphs Abs: 2.1 10*3/uL (ref 0.7–4.0)
MCH: 27 pg (ref 26.0–34.0)
MCHC: 32 g/dL (ref 30.0–36.0)
MCV: 84.3 fL (ref 80.0–100.0)
Monocytes Absolute: 0.5 10*3/uL (ref 0.1–1.0)
Monocytes Relative: 6 %
Neutro Abs: 4.9 10*3/uL (ref 1.7–7.7)
Neutrophils Relative %: 64 %
Platelets: 187 10*3/uL (ref 150–400)
RBC: 5.08 MIL/uL (ref 3.87–5.11)
RDW: 12.7 % (ref 11.5–15.5)
WBC: 7.6 10*3/uL (ref 4.0–10.5)
nRBC: 0 % (ref 0.0–0.2)

## 2023-07-21 LAB — COMPREHENSIVE METABOLIC PANEL WITH GFR
ALT: 23 U/L (ref 0–44)
AST: 32 U/L (ref 15–41)
Albumin: 4.1 g/dL (ref 3.5–5.0)
Alkaline Phosphatase: 55 U/L (ref 38–126)
Anion gap: 12 (ref 5–15)
BUN: 13 mg/dL (ref 6–20)
CO2: 24 mmol/L (ref 22–32)
Calcium: 9.5 mg/dL (ref 8.9–10.3)
Chloride: 104 mmol/L (ref 98–111)
Creatinine, Ser: 0.85 mg/dL (ref 0.44–1.00)
GFR, Estimated: >60 mL/min (ref >60)
Glucose, Bld: 96 mg/dL (ref 70–99)
Potassium: 4.2 mmol/L (ref 3.5–5.1)
Sodium: 140 mmol/L (ref 135–145)
Total Bilirubin: 1.3 mg/dL — ABNORMAL HIGH (ref 0.0–1.2)
Total Protein: 7.5 g/dL (ref 6.5–8.1)

## 2023-07-21 LAB — POC OCCULT BLOOD, ED: Fecal Occult Bld: NEGATIVE

## 2023-07-21 MED ORDER — FENTANYL CITRATE PF 50 MCG/ML IJ SOSY
25.0000 ug | PREFILLED_SYRINGE | Freq: Once | INTRAMUSCULAR | Status: DC
  Filled 2023-07-21: qty 1

## 2023-07-21 MED ORDER — CIPROFLOXACIN HCL 500 MG PO TABS: 500.0000 mg | ORAL_TABLET | Freq: Two times a day (BID) | ORAL | 0 refills | Status: AC

## 2023-07-21 MED ORDER — IOPAMIDOL (ISOVUE-370) INJECTION 76%: 75.0000 mL | Freq: Once | INTRAVENOUS | Status: DC | PRN

## 2023-07-21 MED ORDER — OXYCODONE-ACETAMINOPHEN 5-325 MG PO TABS: 1.0000 | ORAL_TABLET | Freq: Four times a day (QID) | ORAL | 0 refills | Status: AC | PRN

## 2023-07-21 MED ORDER — OXYCODONE-ACETAMINOPHEN 5-325 MG PO TABS
1.0000 | ORAL_TABLET | Freq: Once | ORAL | Status: AC
  Administered 2023-07-21: 1 via ORAL
  Filled 2023-07-21: qty 1

## 2023-07-21 MED ORDER — METRONIDAZOLE 500 MG PO TABS: 500.0000 mg | ORAL_TABLET | Freq: Two times a day (BID) | ORAL | 0 refills | Status: DC

## 2023-07-21 MED ORDER — OXYCODONE-ACETAMINOPHEN 5-325 MG PO TABS: 1.0000 | ORAL_TABLET | Freq: Four times a day (QID) | ORAL | 0 refills | Status: DC | PRN

## 2023-07-21 MED ORDER — METRONIDAZOLE 500 MG PO TABS: 500.0000 mg | ORAL_TABLET | Freq: Two times a day (BID) | ORAL | 0 refills | Status: AC

## 2023-07-21 MED ORDER — IOHEXOL 350 MG/ML SOLN
75.0000 mL | Freq: Once | INTRAVENOUS | Status: AC | PRN
  Administered 2023-07-21: 75 mL via INTRAVENOUS

## 2023-07-21 MED ORDER — LACTATED RINGERS IV BOLUS: 1000.0000 mL | Freq: Once | INTRAVENOUS | Status: DC

## 2023-07-21 MED ORDER — CIPROFLOXACIN HCL 500 MG PO TABS: 500.0000 mg | ORAL_TABLET | Freq: Two times a day (BID) | ORAL | 0 refills | Status: DC

## 2023-07-21 NOTE — ED Triage Notes (Signed)
 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 Pt denies any blood leaking from her rectum when not on the toilet. A/Ox4, did feel nauseous this morning, no emesis. Rates her abd pain 3/10 during triage.

## 2023-07-21 NOTE — ED Provider Notes (Signed)
 Patient seen briefly in triage.  She notes some abdominal cramping in her lower abdomen starting overnight.  She had a loose stool at some point in the night and then is noted dark and bright red blood in the toilet this morning.  She shows me a picture.  No nausea or vomiting or fever.  CV mildly tachycardic regular rhythm  Lungs clear  Abdomen mildly tender in the lower quadrants, but soft  I have asked her to proceed to the emergency room to evaluate the rectal bleeding and cramping.  She will go by private car; her vital signs are overall reassuring.   Ann Keto, MD 07/21/23 [phone]

## 2023-07-21 NOTE — ED Provider Triage Note (Signed)
 Emergency Medicine Provider Triage Evaluation Note  Ashley Snyder , a 53 y.o. female  was evaluated in triage.  Pt complains of rectal bleeding x 2 episodes  Review of Systems  Positive: Lower abdominal pain Negative: fever  Physical Exam  BP (!) 151/97 (BP Location: Left Arm)   Pulse 96   Temp 98.6 F (37 C)   Resp 17   Ht 5\' 7"  (1.702 m)   Wt 74.4 kg   LMP 12/09/2013   SpO2 100%   BMI 25.69 kg/m  Gen:   Awake, no distress   Resp:  Normal effort  MSK:   Moves extremities without difficulty  Other:  Abdomen  tender lower abdomen  Medical Decision Making  Medically screening exam initiated at 4:56 PM.  Appropriate orders placed.  Ashley Snyder was informed that the remainder of the evaluation will be completed by another provider, this initial triage assessment does not replace that evaluation, and the importance of remaining in the ED until their evaluation is complete.     Sandi Crosby, PA-C 07/21/23 1657

## 2023-07-21 NOTE — Discharge Instructions (Addendum)
 Please follow-up outpatient with [facility] gastroenterology for consideration for outpatient colonoscopy in the setting of your symptoms.  Will discharge you on antibiotics and pain medication.  Continue to orally rehydrate, return for any severe worsening symptoms

## 2023-07-21 NOTE — ED Provider Notes (Signed)
 [place] EMERGENCY DEPARTMENT AT [facility] Provider Note   CSN: [id number] Arrival date & time: 07/21/23  1553     History  Chief Complaint  Patient presents with   Rectal Bleeding   Abdominal Pain    Ashley Snyder is a 53 y.o. female.   Rectal Bleeding Associated symptoms: abdominal pain   Abdominal Pain Associated symptoms: hematochezia      53 year old female with medical history significant for DM 2, GERD, HTN, IBS, gastroparesis follows outpatient with [facility] gastroenterology who presents to the emergency department with abdominal pain and bright red blood per rectum.  The patient states that she has had development of right lower quadrant abdominal pain over the past day.  This morning she noticed about a cupful of bright red blood in her stool.  No further episodes.  She endorses low grade right lower quadrant pain, no nausea or vomiting.  Her stool was loose earlier today.  No known suspect food intake.  She has a history of IBS, states that she has a family history of IBD with a mother who had ulcerative colitis.  Home Medications Prior to Admission medications   Medication Sig Start Date End Date Taking? Authorizing Provider  ciprofloxacin (CIPRO) 500 MG tablet Take 1 tablet (500 mg total) by mouth every 12 (twelve) hours. 07/21/23  Yes Rosealee Concha, MD  metroNIDAZOLE (FLAGYL) 500 MG tablet Take 1 tablet (500 mg total) by mouth 2 (two) times daily for 5 days. 07/21/23 07/26/23 Yes Rosealee Concha, MD  oxyCODONE-acetaminophen  (PERCOCET/ROXICET) 5-325 MG tablet Take 1 tablet by mouth every 6 (six) hours as needed for severe pain (pain score 7-10). 07/21/23  Yes Rosealee Concha, MD  ALPRAZolam (XANAX) 0.25 MG tablet Take 0.25 mg by mouth at bedtime as needed for anxiety.    [provider]  Azelastine -Fluticasone  137-50 MCG/ACT SUSP Place 1 spray into the nose 2 (two) times daily. Patient not taking: Reported on 10/21/2019 06/18/19   Brian Campanile, MD  cetirizine (ZYRTEC) 10 MG tablet Take 10 mg by mouth as needed for allergies.    [provider]  cyclobenzaprine (FLEXERIL) 10 MG tablet Take 10 mg by mouth 3 (three) times daily as needed for muscle spasms.  06/13/19   [provider]  fluticasone  (FLONASE ) 50 MCG/ACT nasal spray 2 sprays each nostril once a day as needed for stuffy nose. 10/21/19   Tinnie Forehand, FNP  ibuprofen  (ADVIL ) 200 MG tablet Take 4 tablets (800 mg total) by mouth every 8 (eight) hours as needed for moderate pain. 09/29/19   Woodrow Hazy, MD  ipratropium (ATROVENT ) 0.03 % nasal spray 2 sprays each nostril 2 to 3 times daily as needed for drainage. 10/21/19   Tinnie Forehand, FNP  losartan  (COZAAR ) 25 MG tablet Take 25 mg by mouth daily. 04/20/19   [provider]  metFORMIN  (GLUCOPHAGE ) 500 MG tablet Take 500 mg by mouth 2 (two) times daily with a meal.     [provider]  metoprolol  succinate (TOPROL -XL) 50 MG 24 hr tablet Take 50 mg by mouth daily. Take with or immediately following a meal.    [provider]  rosuvastatin  (CRESTOR ) 5 MG tablet Take 5 mg by mouth daily. 04/20/19   [provider]  SUMAtriptan (IMITREX) 25 MG tablet Take 25 mg by mouth every 2 (two) hours as needed for migraine. May repeat in 2 hours if headache persists or recurs.    [provider]      Allergies  Patient has no known allergies.    Review of Systems   Review of Systems  Gastrointestinal:  Positive for abdominal pain and hematochezia.  All other systems reviewed and are negative.   Physical Exam Updated Vital Signs BP (!) 151/97 (BP Location: Left Arm)   Pulse 96   Temp 98.6 F (37 C)   Resp 17   Ht 5\' 7"  (1.702 m)   Wt 74.4 kg   LMP 12/09/2013   SpO2 100%   BMI 25.69 kg/m  Physical Exam Vitals and nursing note reviewed.  Constitutional:      General: She is not in acute distress.    Appearance: She is well-developed.  HENT:      Head: Normocephalic and atraumatic.  Eyes:     Conjunctiva/sclera: Conjunctivae normal.  Cardiovascular:     Rate and Rhythm: Normal rate and regular rhythm.     Heart sounds: No murmur heard. Pulmonary:     Effort: Pulmonary effort is normal. No respiratory distress.     Breath sounds: Normal breath sounds.  Abdominal:     Palpations: Abdomen is soft.     Tenderness: There is abdominal tenderness in the right lower quadrant. There is no guarding or rebound.  Musculoskeletal:        General: No swelling.     Cervical back: Neck supple.  Skin:    General: Skin is warm and dry.     Capillary Refill: Capillary refill takes less than 2 seconds.  Neurological:     Mental Status: She is alert.  Psychiatric:        Mood and Affect: Mood normal.     ED Results / Procedures / Treatments   Labs (all labs ordered are listed, but only abnormal results are displayed) Labs Reviewed  COMPREHENSIVE METABOLIC PANEL WITH GFR - Abnormal; Notable for the following components:      Result Value   Total Bilirubin 1.3 (*)    All other components within normal limits  CBC WITH DIFFERENTIAL/PLATELET  POC OCCULT BLOOD, ED    EKG None  Radiology CT ABDOMEN PELVIS W CONTRAST Result Date: 07/21/2023 CLINICAL DATA:  Lower GI bleed EXAM: CT ABDOMEN AND PELVIS WITH CONTRAST TECHNIQUE: Multidetector CT imaging of the abdomen and pelvis was performed using the standard protocol following bolus administration of intravenous contrast. RADIATION DOSE REDUCTION: This exam was performed according to the departmental dose-optimization program which includes automated exposure control, adjustment of the mA and/or kV according to patient size and/or use of iterative reconstruction technique. CONTRAST:  75mL OMNIPAQUE IOHEXOL 350 MG/ML SOLN COMPARISON:  02/13/2006 FINDINGS: Lower chest: No acute abnormality Hepatobiliary: No focal hepatic abnormality. Gallbladder unremarkable. Pancreas: No focal abnormality or  ductal dilatation. Spleen: No focal abnormality.  Normal size. Adrenals/Urinary Tract: No adrenal abnormality. No focal renal abnormality. No stones or hydronephrosis. Urinary bladder is unremarkable. Stomach/Bowel: Mild wall thickening involving the descending colon suggesting infectious or inflammatory colitis. Stomach and small bowel decompressed, unremarkable. Vascular/Lymphatic: No evidence of aneurysm or adenopathy. Reproductive: Prior hysterectomy. Left ovarian cyst measures 3.5 cm. Other: No free fluid or free air. Musculoskeletal: No acute bony abnormality. IMPRESSION: Mild wall thickening noted throughout the descending colon suggesting infectious or inflammatory colitis. Electronically Signed   By: Janeece Mechanic M.D.   On: 07/21/2023 19:28    Procedures Procedures    Medications Ordered in ED Medications  iopamidol (ISOVUE-370) 76 % injection 75 mL (has no administration in time range)  iohexol (OMNIPAQUE) 350 MG/ML injection 75 mL (75  mLs Intravenous Contrast Given 07/21/23 1924)  oxyCODONE-acetaminophen  (PERCOCET/ROXICET) 5-325 MG per tablet 1 tablet (1 tablet Oral Given 07/21/23 2048)    ED Course/ Medical Decision Making/ A&P                                 Medical Decision Making Risk Prescription drug management.     53 year old female with medical history significant for DM 2, GERD, HTN, IBS, gastroparesis follows outpatient with [facility] gastroenterology who presents to the emergency department with abdominal pain and bright red blood per rectum.  The patient states that she has had development of right lower quadrant abdominal pain over the past day.  This morning she noticed about a cupful of bright red blood in her stool.  No further episodes.  She endorses low grade right lower quadrant pain, no nausea or vomiting.  Her stool was loose earlier today.  No known suspect food intake.  She has a history of IBS, states that she has a family history of IBD with a mother who had  ulcerative colitis.  On arrival, the patient was afebrile, not tachycardic or tachypneic, BP 151/97, saturating 100% on room air.  Physical exam revealed right lower quadrant tenderness to palpation, no rebound or guarding, differential diagnose includes diverticulitis, colitis, inflammatory bowel disease late onset presentation, foodborne illness.  Laboratory evaluation revealed fecal occult negative, CMP unremarkable, CBC without a leukocytosis or anemia.  CT abdomen pelvis: IMPRESSION:  Mild wall thickening noted throughout the descending colon  suggesting infectious or inflammatory colitis.   Suspect likely acute infectious colitis.  Patient's pain was controlled, will discharge on pain medication, Zofran  as needed for nausea, oral antibiotics.  Discussed with on-call [facility] gastroenterology with plan for close outpatient follow-up given the patient's family history of inflammatory bowel disease.  Spoke with Dr. Gayl Katos.  Patient's pain was notably improved, she was tolerating oral intake, stable for discharge.   Final Clinical Impression(s) / ED Diagnoses Final diagnoses:  Colitis  Right lower quadrant abdominal pain  Lower GI bleed    Rx / DC Orders ED Discharge Orders          Ordered    ciprofloxacin (CIPRO) 500 MG tablet  Every 12 hours        07/21/23 2047    metroNIDAZOLE (FLAGYL) 500 MG tablet  2 times daily        07/21/23 2047    oxyCODONE-acetaminophen  (PERCOCET/ROXICET) 5-325 MG tablet  Every 6 hours PRN        07/21/23 2047              Rosealee Concha, MD 07/21/23 2050

## 2023-07-21 NOTE — ED Triage Notes (Signed)
 Pt reports having blood on her stool starting this morning with some abd cramping.

## 2023-07-21 NOTE — ED Notes (Signed)
 Patient is being discharged from the Urgent Care and sent to the Emergency Department via  . Per POV, patient is in need of higher level of care due to blood on stool with hx of IBS. Patient is aware and verbalizes understanding of plan of care.  Vitals:   07/21/23 1531  BP: (!) 150/86  Pulse: 100  Resp: 18  Temp: 98.6 F (37 C)  SpO2: 100%

## 2023-08-29 DIAGNOSIS — K635 Polyp of colon: Secondary | ICD-10-CM | POA: Diagnosis not present

## 2023-08-29 DIAGNOSIS — K648 Other hemorrhoids: Secondary | ICD-10-CM | POA: Diagnosis not present

## 2023-08-29 DIAGNOSIS — R939 Diagnostic imaging inconclusive due to excess body fat of patient: Secondary | ICD-10-CM | POA: Diagnosis not present

## 2023-08-29 DIAGNOSIS — D122 Benign neoplasm of ascending colon: Secondary | ICD-10-CM | POA: Diagnosis not present

## 2023-11-07 DIAGNOSIS — K3184 Gastroparesis: Secondary | ICD-10-CM | POA: Diagnosis not present

## 2023-11-07 DIAGNOSIS — I10 Essential (primary) hypertension: Secondary | ICD-10-CM | POA: Diagnosis not present

## 2023-11-07 DIAGNOSIS — E1169 Type 2 diabetes mellitus with other specified complication: Secondary | ICD-10-CM | POA: Diagnosis not present

## 2023-11-07 DIAGNOSIS — E7800 Pure hypercholesterolemia, unspecified: Secondary | ICD-10-CM | POA: Diagnosis not present

## 2024-02-03 DIAGNOSIS — I10 Essential (primary) hypertension: Secondary | ICD-10-CM | POA: Diagnosis not present

## 2024-02-03 DIAGNOSIS — K625 Hemorrhage of anus and rectum: Secondary | ICD-10-CM | POA: Diagnosis not present

## 2024-02-04 DIAGNOSIS — K625 Hemorrhage of anus and rectum: Secondary | ICD-10-CM | POA: Diagnosis not present

## 2024-03-07 IMAGING — CR DG CHEST 2V
2 series · 2 of 2 positions shown · non-contrast
Comparison: 09/04/2014

CLINICAL DATA: Right chest pain

EXAM:
CHEST - 2 VIEW

[chest pa]
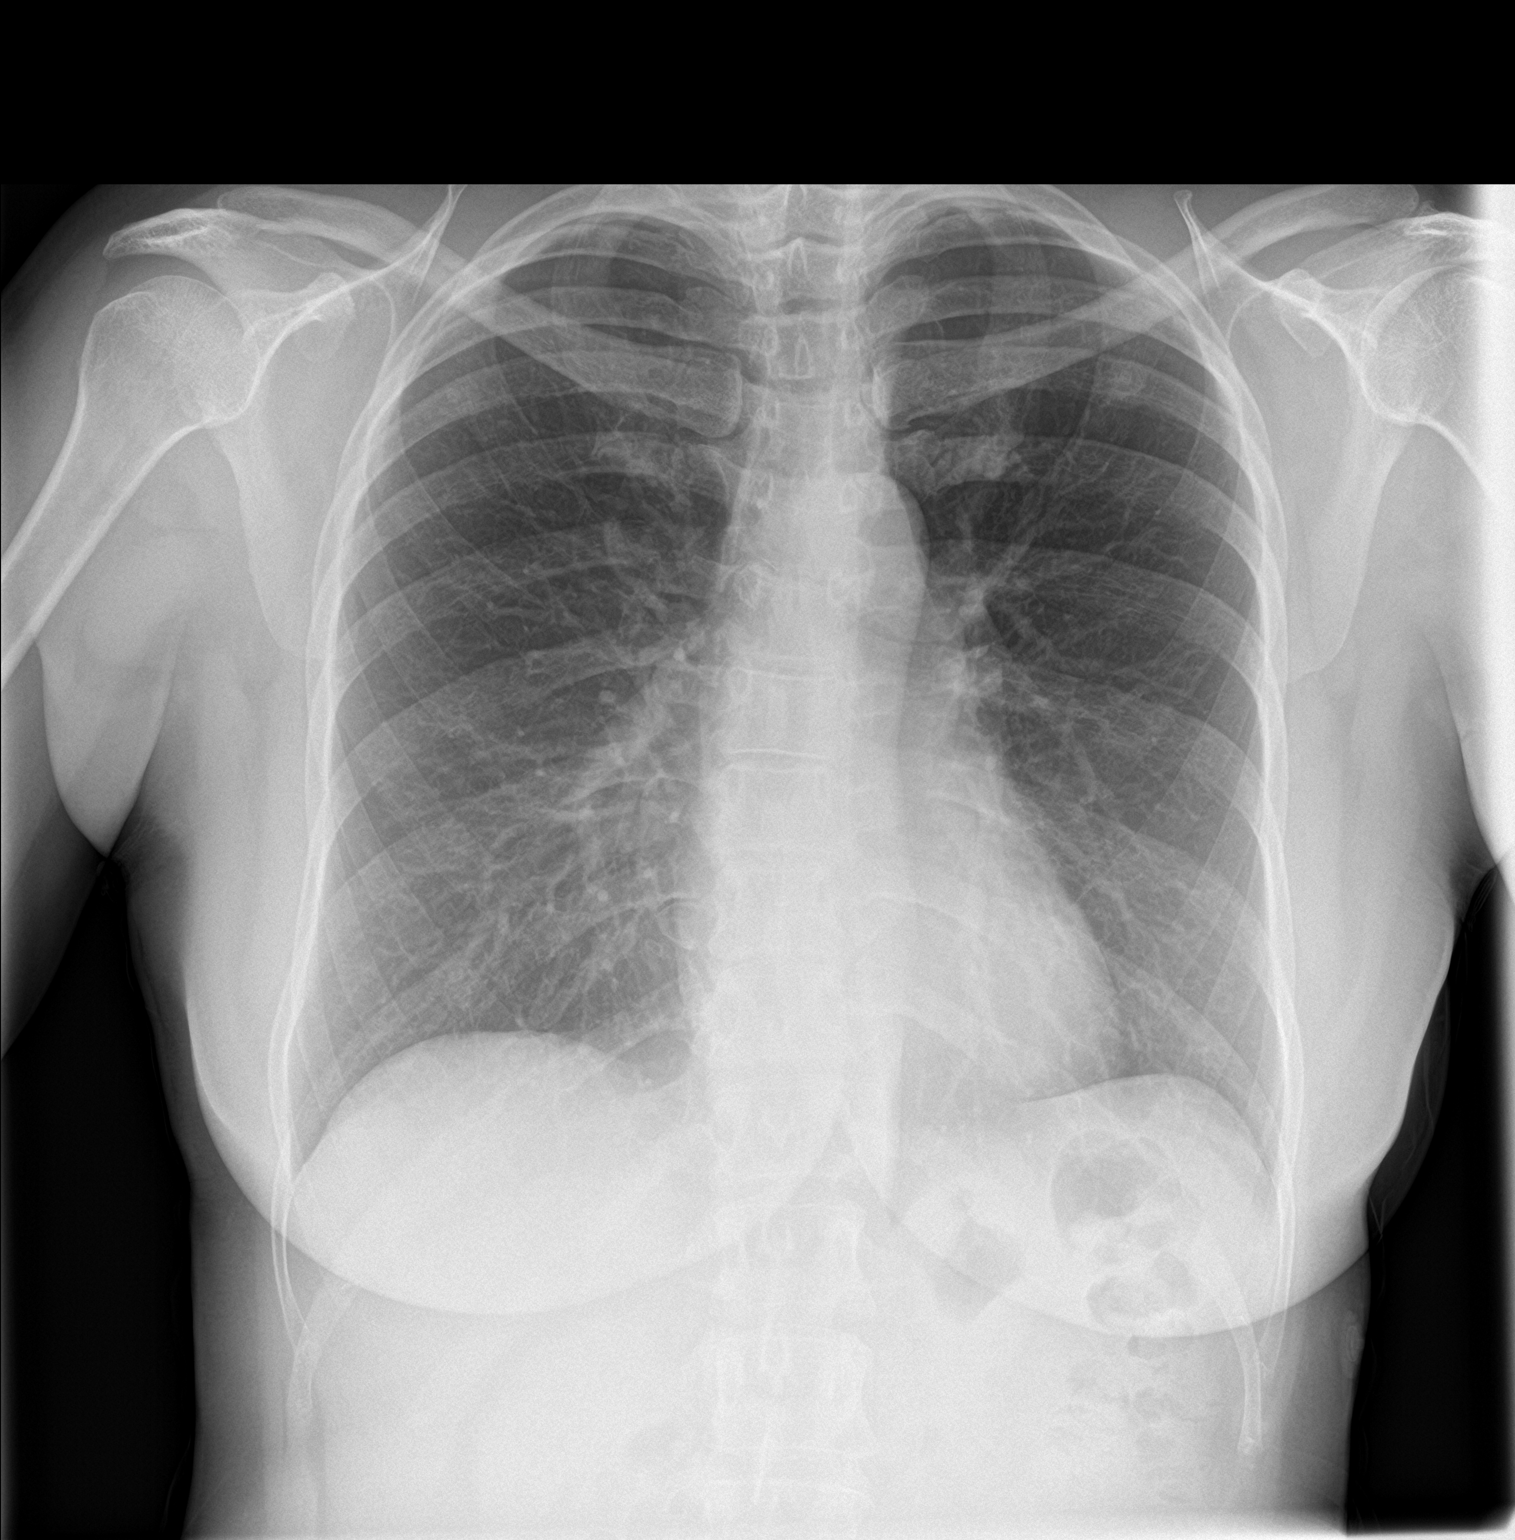

[chest lat]
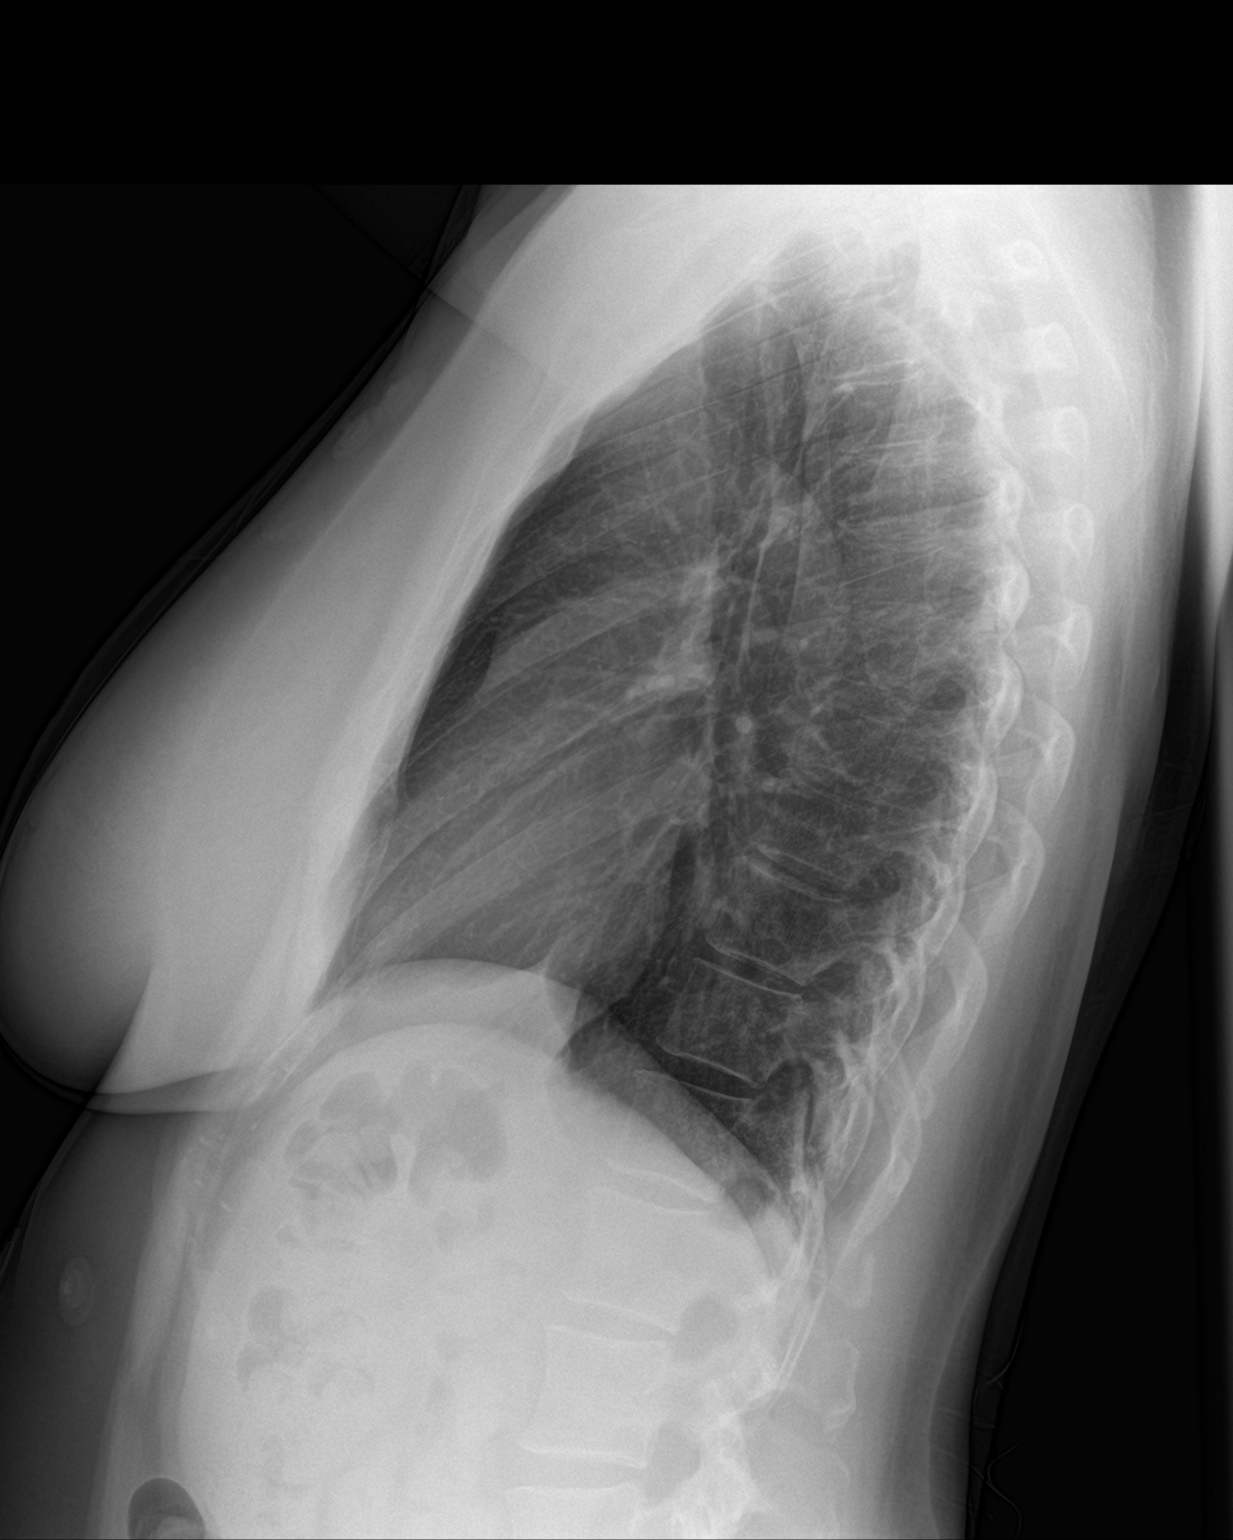

[2 of 2 positions shown; findings below may reference images not displayed]

FINDINGS: The heart size and mediastinal contours are within normal limits.
Both lungs are clear. The visualized skeletal structures are
unremarkable.
IMPRESSION: No active cardiopulmonary disease.
